# Patient Record
Sex: Male | Born: 1945
Health system: Southern US, Community
[De-identification: ages and names within clinical notes are randomized; demographics above are authoritative.]

## PROBLEM LIST (undated history)

## (undated) ENCOUNTER — Emergency Department (HOSPITAL_COMMUNITY): Admission: EM | Discharge status: Absent without leave | Payer: PPO

## (undated) DIAGNOSIS — I509 Heart failure, unspecified: Secondary | ICD-10-CM

---

## 2016-06-10 DIAGNOSIS — M9905 Segmental and somatic dysfunction of pelvic region: Secondary | ICD-10-CM | POA: Diagnosis not present

## 2016-06-10 DIAGNOSIS — M9903 Segmental and somatic dysfunction of lumbar region: Secondary | ICD-10-CM | POA: Diagnosis not present

## 2016-06-10 DIAGNOSIS — M9902 Segmental and somatic dysfunction of thoracic region: Secondary | ICD-10-CM | POA: Diagnosis not present

## 2016-06-10 DIAGNOSIS — M545 Low back pain: Secondary | ICD-10-CM | POA: Diagnosis not present

## 2016-12-28 DIAGNOSIS — M109 Gout, unspecified: Secondary | ICD-10-CM | POA: Diagnosis not present

## 2018-05-11 DIAGNOSIS — M9902 Segmental and somatic dysfunction of thoracic region: Secondary | ICD-10-CM | POA: Diagnosis not present

## 2018-05-11 DIAGNOSIS — M9905 Segmental and somatic dysfunction of pelvic region: Secondary | ICD-10-CM | POA: Diagnosis not present

## 2018-05-11 DIAGNOSIS — M9903 Segmental and somatic dysfunction of lumbar region: Secondary | ICD-10-CM | POA: Diagnosis not present

## 2018-05-11 DIAGNOSIS — M545 Low back pain: Secondary | ICD-10-CM | POA: Diagnosis not present

## 2018-05-26 DIAGNOSIS — R008 Other abnormalities of heart beat: Secondary | ICD-10-CM | POA: Diagnosis not present

## 2018-05-26 DIAGNOSIS — Z125 Encounter for screening for malignant neoplasm of prostate: Secondary | ICD-10-CM | POA: Diagnosis not present

## 2018-05-26 DIAGNOSIS — E039 Hypothyroidism, unspecified: Secondary | ICD-10-CM | POA: Diagnosis not present

## 2018-05-26 DIAGNOSIS — B372 Candidiasis of skin and nail: Secondary | ICD-10-CM | POA: Diagnosis not present

## 2018-05-26 DIAGNOSIS — R109 Unspecified abdominal pain: Secondary | ICD-10-CM | POA: Diagnosis not present

## 2018-05-26 DIAGNOSIS — N400 Enlarged prostate without lower urinary tract symptoms: Secondary | ICD-10-CM | POA: Diagnosis not present

## 2018-05-26 DIAGNOSIS — R030 Elevated blood-pressure reading, without diagnosis of hypertension: Secondary | ICD-10-CM | POA: Diagnosis not present

## 2018-05-26 DIAGNOSIS — G4700 Insomnia, unspecified: Secondary | ICD-10-CM | POA: Diagnosis not present

## 2018-05-26 DIAGNOSIS — B351 Tinea unguium: Secondary | ICD-10-CM | POA: Diagnosis not present

## 2018-05-26 DIAGNOSIS — K529 Noninfective gastroenteritis and colitis, unspecified: Secondary | ICD-10-CM | POA: Diagnosis not present

## 2018-06-30 DIAGNOSIS — K529 Noninfective gastroenteritis and colitis, unspecified: Secondary | ICD-10-CM | POA: Diagnosis not present

## 2018-06-30 DIAGNOSIS — Z13220 Encounter for screening for lipoid disorders: Secondary | ICD-10-CM | POA: Diagnosis not present

## 2018-06-30 DIAGNOSIS — D7589 Other specified diseases of blood and blood-forming organs: Secondary | ICD-10-CM | POA: Diagnosis not present

## 2018-06-30 DIAGNOSIS — E8809 Other disorders of plasma-protein metabolism, not elsewhere classified: Secondary | ICD-10-CM | POA: Diagnosis not present

## 2018-06-30 DIAGNOSIS — Z0000 Encounter for general adult medical examination without abnormal findings: Secondary | ICD-10-CM | POA: Diagnosis not present

## 2018-06-30 DIAGNOSIS — K922 Gastrointestinal hemorrhage, unspecified: Secondary | ICD-10-CM | POA: Diagnosis not present

## 2018-06-30 DIAGNOSIS — R008 Other abnormalities of heart beat: Secondary | ICD-10-CM | POA: Diagnosis not present

## 2018-07-26 DIAGNOSIS — R739 Hyperglycemia, unspecified: Secondary | ICD-10-CM | POA: Diagnosis not present

## 2018-07-26 DIAGNOSIS — E538 Deficiency of other specified B group vitamins: Secondary | ICD-10-CM | POA: Diagnosis not present

## 2018-07-26 DIAGNOSIS — K529 Noninfective gastroenteritis and colitis, unspecified: Secondary | ICD-10-CM | POA: Diagnosis not present

## 2018-07-26 DIAGNOSIS — K922 Gastrointestinal hemorrhage, unspecified: Secondary | ICD-10-CM | POA: Diagnosis not present

## 2018-08-25 DIAGNOSIS — R1031 Right lower quadrant pain: Secondary | ICD-10-CM | POA: Diagnosis not present

## 2018-08-25 DIAGNOSIS — R195 Other fecal abnormalities: Secondary | ICD-10-CM | POA: Diagnosis not present

## 2018-09-21 DIAGNOSIS — C189 Malignant neoplasm of colon, unspecified: Secondary | ICD-10-CM | POA: Diagnosis not present

## 2018-09-21 DIAGNOSIS — R1031 Right lower quadrant pain: Secondary | ICD-10-CM | POA: Diagnosis not present

## 2018-09-21 DIAGNOSIS — K5730 Diverticulosis of large intestine without perforation or abscess without bleeding: Secondary | ICD-10-CM | POA: Diagnosis not present

## 2018-09-21 DIAGNOSIS — C187 Malignant neoplasm of sigmoid colon: Secondary | ICD-10-CM | POA: Diagnosis not present

## 2018-09-21 DIAGNOSIS — R195 Other fecal abnormalities: Secondary | ICD-10-CM | POA: Diagnosis not present

## 2018-09-26 DIAGNOSIS — N400 Enlarged prostate without lower urinary tract symptoms: Secondary | ICD-10-CM | POA: Diagnosis not present

## 2018-09-26 DIAGNOSIS — I700 Atherosclerosis of aorta: Secondary | ICD-10-CM | POA: Diagnosis not present

## 2018-09-26 DIAGNOSIS — C187 Malignant neoplasm of sigmoid colon: Secondary | ICD-10-CM | POA: Diagnosis not present

## 2018-09-27 DIAGNOSIS — C187 Malignant neoplasm of sigmoid colon: Secondary | ICD-10-CM | POA: Diagnosis not present

## 2018-09-27 DIAGNOSIS — Z6823 Body mass index (BMI) 23.0-23.9, adult: Secondary | ICD-10-CM | POA: Diagnosis not present

## 2018-09-28 DIAGNOSIS — C187 Malignant neoplasm of sigmoid colon: Secondary | ICD-10-CM | POA: Diagnosis not present

## 2018-09-28 DIAGNOSIS — N401 Enlarged prostate with lower urinary tract symptoms: Secondary | ICD-10-CM | POA: Diagnosis not present

## 2018-10-10 DIAGNOSIS — N401 Enlarged prostate with lower urinary tract symptoms: Secondary | ICD-10-CM | POA: Diagnosis not present

## 2018-10-10 DIAGNOSIS — Z87891 Personal history of nicotine dependence: Secondary | ICD-10-CM | POA: Diagnosis not present

## 2018-10-10 DIAGNOSIS — M109 Gout, unspecified: Secondary | ICD-10-CM | POA: Diagnosis not present

## 2018-10-10 DIAGNOSIS — C187 Malignant neoplasm of sigmoid colon: Secondary | ICD-10-CM | POA: Diagnosis not present

## 2018-10-10 DIAGNOSIS — Z298 Encounter for other specified prophylactic measures: Secondary | ICD-10-CM | POA: Diagnosis not present

## 2018-10-10 DIAGNOSIS — K219 Gastro-esophageal reflux disease without esophagitis: Secondary | ICD-10-CM | POA: Diagnosis not present

## 2018-10-10 DIAGNOSIS — Z79899 Other long term (current) drug therapy: Secondary | ICD-10-CM | POA: Diagnosis not present

## 2018-10-10 DIAGNOSIS — N400 Enlarged prostate without lower urinary tract symptoms: Secondary | ICD-10-CM | POA: Diagnosis not present

## 2018-10-10 DIAGNOSIS — Z01818 Encounter for other preprocedural examination: Secondary | ICD-10-CM | POA: Diagnosis not present

## 2018-10-12 DIAGNOSIS — N401 Enlarged prostate with lower urinary tract symptoms: Secondary | ICD-10-CM | POA: Diagnosis not present

## 2018-10-12 DIAGNOSIS — C187 Malignant neoplasm of sigmoid colon: Secondary | ICD-10-CM | POA: Diagnosis not present

## 2018-10-12 DIAGNOSIS — R338 Other retention of urine: Secondary | ICD-10-CM | POA: Diagnosis not present

## 2018-10-13 ENCOUNTER — Other Ambulatory Visit: Payer: Self-pay

## 2018-10-13 NOTE — Patient Outreach (Signed)
[facility]) Care Management  10/13/2018  VICTORHUGO PREIS 1946/09/04 [id number]     Transition of Care Referral  Referral Date: 10/13/18 Referral Source: HTA Discharge Report Date of Admission:unknown Diagnosis: unknown Date of Discharge: 10/11/18 Facility: Health Insurance: HTA    Outreach attempt # 1 to patient. No answer after multiple rings and unable to leave message.    Plan: RN CM will make outreach attempt to patient within 3-4 business days. RN CM will send unsuccessful letter to patient.   Enzo Montgomery, RN,BSN,CCM [place] Management Telephonic Care Management Coordinator Direct Phone: [phone] Toll Free: [phone] Fax: [phone]

## 2018-10-14 ENCOUNTER — Other Ambulatory Visit: Payer: PPO

## 2018-10-14 NOTE — Patient Outreach (Signed)
[facility]) Care Management  10/14/2018  Bryce Cruz 09/29/1946 [id number]   Transition of Care Referral  Referral Date: 10/13/18 Referral Source: HTA Discharge Report Date of Admission:unknown Diagnosis: unknown Date of Discharge: 10/11/18 Facility:[facility] Insurance: HTA   Outreach attempt #2 to patient. No answer at present.    Plan: RN CM will make outreach attempt to patient within 3-4 business days.  Enzo Montgomery, RN,BSN,CCM [place] Management Telephonic Care Management Coordinator Direct Phone: [phone] Toll Free: [phone] Fax: [phone]

## 2018-10-17 DIAGNOSIS — N401 Enlarged prostate with lower urinary tract symptoms: Secondary | ICD-10-CM | POA: Diagnosis not present

## 2018-10-17 DIAGNOSIS — R338 Other retention of urine: Secondary | ICD-10-CM | POA: Diagnosis not present

## 2018-10-20 ENCOUNTER — Other Ambulatory Visit: Payer: Self-pay

## 2018-10-20 NOTE — Patient Outreach (Signed)
[facility]) Care Management  10/20/2018  FERREL SIMINGTON [birth year] [id number]   Transition of Care Referral  Referral Date:10/13/18 Referral Source:HTA Discharge Report Date of Admission:unknown Diagnosis:unknown Date of Discharge:10/11/18 Facility:[facility] Insurance:HTA    Outreach attempt #3 to patient. No answer at present.     Plan: RN CM will close case if no response from letter mailed to patient.   Enzo Montgomery, RN,BSN,CCM [place] Management Telephonic Care Management Coordinator Direct Phone: [phone] Toll Free: [phone] Fax: [phone]

## 2018-10-21 DIAGNOSIS — C187 Malignant neoplasm of sigmoid colon: Secondary | ICD-10-CM | POA: Diagnosis not present

## 2018-10-27 DIAGNOSIS — N401 Enlarged prostate with lower urinary tract symptoms: Secondary | ICD-10-CM | POA: Diagnosis not present

## 2018-10-27 DIAGNOSIS — C187 Malignant neoplasm of sigmoid colon: Secondary | ICD-10-CM | POA: Diagnosis not present

## 2018-10-31 ENCOUNTER — Other Ambulatory Visit: Payer: Self-pay

## 2018-10-31 NOTE — Patient Outreach (Signed)
[facility]) Care Management  10/31/2018  Bryce Cruz November 02, 1945 [id number]   Transition of Care Referral  Referral Date:10/13/18 Referral Source:HTA Discharge Report Date of Admission:unknown Diagnosis:unknown Date of Discharge:10/11/18 Facility:[facility] Insurance:HTA   Multiple attempts to establish contact with patient without success. No response from letter mailed to patient. Case is being closed at this time.     Plan: RN CM will close case at this time.  Enzo [place], RN,BSN,CCM [place] Management Telephonic Care Management Coordinator Direct Phone: [phone] Toll Free: [phone] Fax: [phone]

## 2018-11-14 DIAGNOSIS — N400 Enlarged prostate without lower urinary tract symptoms: Secondary | ICD-10-CM | POA: Diagnosis not present

## 2018-11-14 DIAGNOSIS — Z79899 Other long term (current) drug therapy: Secondary | ICD-10-CM | POA: Diagnosis not present

## 2018-11-14 DIAGNOSIS — C187 Malignant neoplasm of sigmoid colon: Secondary | ICD-10-CM | POA: Diagnosis not present

## 2018-11-29 DIAGNOSIS — R339 Retention of urine, unspecified: Secondary | ICD-10-CM | POA: Diagnosis not present

## 2018-11-29 DIAGNOSIS — Z6824 Body mass index (BMI) 24.0-24.9, adult: Secondary | ICD-10-CM | POA: Diagnosis not present

## 2018-11-29 DIAGNOSIS — C187 Malignant neoplasm of sigmoid colon: Secondary | ICD-10-CM | POA: Diagnosis not present

## 2018-11-29 DIAGNOSIS — N390 Urinary tract infection, site not specified: Secondary | ICD-10-CM | POA: Diagnosis not present

## 2018-11-29 DIAGNOSIS — N401 Enlarged prostate with lower urinary tract symptoms: Secondary | ICD-10-CM | POA: Diagnosis not present

## 2018-12-08 DIAGNOSIS — B370 Candidal stomatitis: Secondary | ICD-10-CM | POA: Diagnosis not present

## 2018-12-08 DIAGNOSIS — R339 Retention of urine, unspecified: Secondary | ICD-10-CM | POA: Diagnosis not present

## 2018-12-08 DIAGNOSIS — N401 Enlarged prostate with lower urinary tract symptoms: Secondary | ICD-10-CM | POA: Diagnosis not present

## 2018-12-08 DIAGNOSIS — N390 Urinary tract infection, site not specified: Secondary | ICD-10-CM | POA: Diagnosis not present

## 2019-02-08 DIAGNOSIS — R030 Elevated blood-pressure reading, without diagnosis of hypertension: Secondary | ICD-10-CM | POA: Diagnosis not present

## 2019-02-08 DIAGNOSIS — J302 Other seasonal allergic rhinitis: Secondary | ICD-10-CM | POA: Diagnosis not present

## 2019-02-08 DIAGNOSIS — R05 Cough: Secondary | ICD-10-CM | POA: Diagnosis not present

## 2019-02-08 DIAGNOSIS — C187 Malignant neoplasm of sigmoid colon: Secondary | ICD-10-CM | POA: Diagnosis not present

## 2019-02-17 DIAGNOSIS — Z79899 Other long term (current) drug therapy: Secondary | ICD-10-CM | POA: Diagnosis not present

## 2019-02-17 DIAGNOSIS — Z85038 Personal history of other malignant neoplasm of large intestine: Secondary | ICD-10-CM | POA: Diagnosis not present

## 2019-02-17 DIAGNOSIS — I447 Left bundle-branch block, unspecified: Secondary | ICD-10-CM | POA: Diagnosis not present

## 2019-02-17 DIAGNOSIS — M109 Gout, unspecified: Secondary | ICD-10-CM | POA: Diagnosis not present

## 2019-02-17 DIAGNOSIS — R0602 Shortness of breath: Secondary | ICD-10-CM | POA: Diagnosis not present

## 2019-02-17 DIAGNOSIS — F17210 Nicotine dependence, cigarettes, uncomplicated: Secondary | ICD-10-CM | POA: Diagnosis not present

## 2019-02-17 DIAGNOSIS — I5023 Acute on chronic systolic (congestive) heart failure: Secondary | ICD-10-CM | POA: Diagnosis not present

## 2019-02-17 DIAGNOSIS — I509 Heart failure, unspecified: Secondary | ICD-10-CM | POA: Diagnosis not present

## 2019-02-17 DIAGNOSIS — R740 Nonspecific elevation of levels of transaminase and lactic acid dehydrogenase [LDH]: Secondary | ICD-10-CM | POA: Diagnosis not present

## 2019-02-18 DIAGNOSIS — I5021 Acute systolic (congestive) heart failure: Secondary | ICD-10-CM | POA: Diagnosis not present

## 2019-02-18 DIAGNOSIS — R0602 Shortness of breath: Secondary | ICD-10-CM | POA: Diagnosis not present

## 2019-02-18 DIAGNOSIS — I447 Left bundle-branch block, unspecified: Secondary | ICD-10-CM | POA: Diagnosis not present

## 2019-02-18 DIAGNOSIS — I340 Nonrheumatic mitral (valve) insufficiency: Secondary | ICD-10-CM | POA: Diagnosis not present

## 2019-02-18 DIAGNOSIS — I361 Nonrheumatic tricuspid (valve) insufficiency: Secondary | ICD-10-CM | POA: Diagnosis not present

## 2019-02-18 DIAGNOSIS — I509 Heart failure, unspecified: Secondary | ICD-10-CM | POA: Diagnosis not present

## 2019-02-18 DIAGNOSIS — C187 Malignant neoplasm of sigmoid colon: Secondary | ICD-10-CM | POA: Diagnosis not present

## 2019-02-21 ENCOUNTER — Ambulatory Visit (HOSPITAL_COMMUNITY)
Admission: RE | Admit: 2019-02-21 | Discharge: 2019-02-21 | Disposition: A | Discharge status: Home / Self Care | Payer: PPO | Source: Ambulatory Visit | Attending: Internal Medicine | Admitting: Internal Medicine

## 2019-02-21 ENCOUNTER — Other Ambulatory Visit: Payer: Self-pay

## 2019-02-21 DIAGNOSIS — I447 Left bundle-branch block, unspecified: Secondary | ICD-10-CM | POA: Diagnosis not present

## 2019-02-21 DIAGNOSIS — I5021 Acute systolic (congestive) heart failure: Secondary | ICD-10-CM

## 2019-02-21 MED ORDER — FUROSEMIDE 40 MG PO TABS: 40.0000 mg | ORAL_TABLET | Freq: Every day | ORAL | 6 refills | Status: DC

## 2019-02-21 MED ORDER — POTASSIUM CHLORIDE CRYS ER 20 MEQ PO TBCR: 20.0000 meq | EXTENDED_RELEASE_TABLET | Freq: Every day | ORAL | 6 refills | Status: DC

## 2019-02-21 NOTE — Addendum Note (Signed)
Encounter addended by: Scarlette Calico, RN on: 02/21/2019 1:46 PM  Actions taken: Clinical Note Signed

## 2019-02-21 NOTE — Patient Instructions (Signed)
Start Furosemide 40 mg daily  Start Potassium (k-dur) 20 meq daily  Patient aware of changes via phone  Follow up 5/12 at 3pm with telehealth visit with Dr Haroldine Laws

## 2019-02-21 NOTE — Addendum Note (Signed)
Encounter addended by: Scarlette Calico, RN on: 02/21/2019 3:38 PM  Actions taken: Pharmacy for encounter modified, Order list changed, Clinical Note Signed

## 2019-02-21 NOTE — Progress Notes (Signed)
Orders per Dr Haroldine Laws: 1. Please call in lasix 40mg  and kcl 20 meq take prn only for SOB and swelling.  2. Telehealth visit with me in 2 weeks  3. R/L cath when we are able to schedule.    Attempted to call pt to discuss and Left message to call back to discuss and let us know what pharmacy to send meds to .

## 2019-02-21 NOTE — Progress Notes (Signed)
Spoke w/pt via phone.  He is aware, agreeable and verbalizes understanding of new prescriptions, they have been sent to pharmacy for him.  Follow up telehealth visit sch for 5/12 with Dr Haroldine Laws.

## 2019-02-21 NOTE — Progress Notes (Addendum)
Heart Failure TeleHealth Note  Due to national recommendations of social distancing due to [place] 19, Audio/video telehealth visit is felt to be most appropriate for this patient at this time.  See MyChart message from today for patient consent regarding telehealth for [name].  Date:  02/21/2019   ID:  Bryce Cruz, DOB Nov 09, 1945, MRN [id number]  Location: Home  Provider location: [facility] Type of Visit: New consult from Dr. Fredricka Bonine  PCP:  Patient, No Pcp Per  Cardiologist:  No primary care provider on file. Primary HF: New- Bryce Cruz  Chief Complaint: Heart Failure follow-up   History of Present Illness:  73 y/o male with h/o colon cancer (resected 12/19. No chemo yet) but no other PMHx.  Admitted to [facility] this past weekend (02/18/19) with acute HF and orthopnea. EF found to be 20% by echo. Also found to have LBBB. No CP trop negative. Diuresed and discharged home on spitro 12.5, losartan 25  He presents via [employer] (consulting) for a telehealth HF consult today.     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 Denies any h/o CP or SOB prior. Very rare smoking. I "bum a cigarette once in a while."   Now feeling better. No further orthopnea or PND. Denies CP. Back to baseline doing all activities without problem though he says his energy level still isn't back to normal after colon surgery.  No dizziness.  BP 112/54.   Bryce Cruz denies symptoms worrisome for COVID 19.   PMHx 1. LBBB (unknown duration) 2. Colon cancer s/r resection  Medications 1. Spiro 12.5 daily 2. Losartan 25 daily   Allergies:  NKDA  Social History:  Occasional tobacco. No ETOH   Family History:  Mom - CAD s/p CABG deceased Dad - died from SCD (heavy smoker and drinker) Sister with PPM Younger sister is disabled  Review of Systems: [y] = yes, [ ]  = no    General: Weight gain [] ; Weight loss [ ] ; Anorexia [  ]; Fatigue [y]; Fever [ ] ; Chills [ ] ; Weakness [ ]    Cardiac: Chest pain/pressure [ ] ; Resting SOB [  ]; Exertional SOB [ ] ; Orthopnea Blue.Reese ]; Pedal Edema [] ; Palpitations [ ] ; Syncope [ ] ; Presyncope [ ] ; Paroxysmal nocturnal dyspnea[ ]    Pulmonary: Cough [ ] ; Wheezing[ ] ; Hemoptysis[ ] ; Sputum [ ] ; Snoring [ ]    GI: Vomiting[ ] ; Dysphagia[ ] ; Melena[ ] ; Hematochezia [ ] ; Heartburn[ ] ; Abdominal pain [ ] ; Constipation [ ] ; Diarrhea [ ] ; BRBPR [ ]    GU: Hematuria[ ] ; Dysuria [ ] ; Nocturia[ ]   Vascular: Pain in legs with walking [ ] ; Pain in feet with lying flat [ ] ; Non-healing sores [ ] ; Stroke [ ] ; TIA [ ] ; Slurred speech [ ] ;   Neuro: Headaches[ ] ; Vertigo[ ] ; Seizures[ ] ; Paresthesias[ ] ;Blurred vision [ ] ; Diplopia [ ] ; Vision changes [ ]    Ortho/Skin: Arthritis [y]; Joint pain [y]; Muscle pain [ ] ; Joint swelling [ ] ; Back Pain [ ] ; Rash [ ]    Psych: Depression[ ] ; Anxiety[ ]    Heme: Bleeding problems [ ] ; Clotting disorders [ ] ; Anemia [ ]    Endocrine: Diabetes [ ] ; Thyroid dysfunction[ ]    Exam:  (Video/Tele Health Call; Exam is subjective and or/visual.) General:  Speaks in full sentences. No resp difficulty. Lungs: Normal respiratory effort with conversation.  Abdomen: Non-distended per patient report Extremities: Pt denies edema. Neuro: Alert & oriented x  3.   Recent Labs: No results found for requested labs within last 8760 hours.  Personally reviewed   Wt Readings from Last 3 Encounters:  No data found for Wt      ASSESSMENT AND PLAN:  1.  Acute systolic HF - new diagnosis 4/20 - Etiology unclear. Possibly LBBB-CM (was told his ECG was a bit abnormal prior to colon cancer surgery) - Volume status ok. NYHA I-II - Continue spiro 12.5 and losartan 25 daily - Will call in lasix 40 and Kcl 20 prn for volume overload.  - Will repeat telehealth visit in 2 weeks - Will need cath and cMRI once restrictions lifted in early June.  - Discussed possible need  for CRT in future.   2. Colon CA - s/p resection    COVID screen The patient does not have any symptoms that suggest any further testing/ screening at this time.  Social distancing reinforced today.  Recommended follow-up:  As above  Relevant cardiac medications were reviewed at length with the patient today.   The patient does not have concerns regarding their medications at this time.   The following changes were made today:  As above  Today, I have spent 40 minutes with the patient with telehealth technology discussing the above issues .    Signed, Glori Bickers, MD  02/21/2019 10:05 AM  Advanced Heart Failure [place] [address] [phone] (office) [phone] (fax)

## 2019-03-07 ENCOUNTER — Other Ambulatory Visit: Payer: Self-pay

## 2019-03-07 ENCOUNTER — Ambulatory Visit (HOSPITAL_COMMUNITY)
Admission: RE | Admit: 2019-03-07 | Discharge: 2019-03-07 | Disposition: A | Discharge status: Home / Self Care | Payer: PPO | Source: Ambulatory Visit | Attending: Internal Medicine | Admitting: Internal Medicine

## 2019-03-07 DIAGNOSIS — I447 Left bundle-branch block, unspecified: Secondary | ICD-10-CM

## 2019-03-07 DIAGNOSIS — I5022 Chronic systolic (congestive) heart failure: Secondary | ICD-10-CM | POA: Diagnosis not present

## 2019-03-07 MED ORDER — LOSARTAN POTASSIUM 50 MG PO TABS: 50.0000 mg | ORAL_TABLET | Freq: Every day | ORAL | 1 refills | Status: DC

## 2019-03-07 NOTE — Progress Notes (Addendum)
Spoke with patient about AVS instructions. Pt will have labs done at Dr. Oren Binet office this week.  Pt also notes that he cannot schedule R/L CATH at this time because he does not know his schedule for Late June. Advised to call office early June to schedule.  Pt amenable to plan.  Pt verbalized understanding of instructions. AVS mailed

## 2019-03-07 NOTE — Addendum Note (Signed)
Encounter addended by: Valeda Malm, RN on: 03/07/2019 4:32 PM  Actions taken: Order list changed, Diagnosis association updated, Clinical Note Signed

## 2019-03-07 NOTE — Patient Instructions (Addendum)
INCREASE Losartan to 50mg  (1 tab) daily  Labs this week at Dr. Joya Gaskins office We will only contact you if something comes back abnormal or we need to make some changes. Otherwise no news is good news!   Your physician recommends that you schedule a follow-up appointment in: mid June for Cath and Cardiac MRI. PLEASE CALL OFFICE EARLY June TO SCHEDULE CATH [phone].

## 2019-03-07 NOTE — Progress Notes (Addendum)
Heart Failure TeleHealth Note  Due to national recommendations of social distancing due to [place] 19, Audio/video telehealth visit is felt to be most appropriate for this patient at this time.  See MyChart message from today for patient consent regarding telehealth for [facility]  Date:  03/07/2019   ID:  Bryce Cruz, DOB [birth year], MRN [id number]  Location: Home  Provider location: [facility] Type of Visit: New consult from Dr. Fredricka Bonine  PCP:  Patient, No Pcp Per  Cardiologist:  No primary care provider on file. Primary HF: New- Bryce Cruz  Chief Complaint: Heart Failure follow-up   History of Present Illness:  73 y/o male with h/o colon cancer (resected 12/19. No chemo yet) but no other PMHx.  Admitted to [facility] this past weekend (02/18/19) with acute HF and orthopnea. EF found to be 20% by echo. Also found to have LBBB. No CP trop negative. Diuresed and discharged home on spitro 12.5, losartan 25  He presents via [employer] (consulting) for a telehealth HF visit today in setting of COVID pandemic.  At our last visit was feeling well. Getting his strength back after colon surgery. BP low so we didn't titrate meds aggressively. Did give him prn lasix to use for any volume overload. Using lasix 20 bid. No SOB, orthopnea, edema or dizziness.  Checking BP every day. SBP 118-125  HR 60-70   Bryce Cruz denies symptoms worrisome for COVID 19.   PMHx 1. LBBB (unknown duration) 2. Colon cancer s/r resection  Medications 1. Spiro 12.5 daily 2. Losartan 25 daily  3. Lasix 40 with Kcl 20 taking PRN onl  Allergies:  NKDA  Social History:  Occasional tobacco. No ETOH   Family History:  Mom - CAD s/p CABG deceased Dad - died from SCD (heavy smoker and drinker) Sister with PPM Younger sister is disabled  Review of Systems: [y] = yes, [ ]  = no    General: Weight gain [] ; Weight loss [ ] ; Anorexia [ ] ; Fatigue [y]; Fever [ ] ;  Chills [ ] ; Weakness [ ]    Cardiac: Chest pain/pressure [ ] ; Resting SOB [  ]; Exertional SOB [ ] ; Orthopnea Blue.Reese ]; Pedal Edema [] ; Palpitations [ ] ; Syncope [ ] ; Presyncope [ ] ; Paroxysmal nocturnal dyspnea[ ]    Pulmonary: Cough [ ] ; Wheezing[ ] ; Hemoptysis[ ] ; Sputum [ ] ; Snoring [ ]    GI: Vomiting[ ] ; Dysphagia[ ] ; Melena[ ] ; Hematochezia [ ] ; Heartburn[ ] ; Abdominal pain [ ] ; Constipation [ ] ; Diarrhea [ ] ; BRBPR [ ]    GU: Hematuria[ ] ; Dysuria [ ] ; Nocturia[ ]   Vascular: Pain in legs with walking [ ] ; Pain in feet with lying flat [ ] ; Non-healing sores [ ] ; Stroke [ ] ; TIA [ ] ; Slurred speech [ ] ;   Neuro: Headaches[ ] ; Vertigo[ ] ; Seizures[ ] ; Paresthesias[ ] ;Blurred vision [ ] ; Diplopia [ ] ; Vision changes [ ]    Ortho/Skin: Arthritis [y]; Joint pain [y]; Muscle pain [ ] ; Joint swelling [ ] ; Back Pain [ ] ; Rash [ ]    Psych: Depression[ ] ; Anxiety[ ]    Heme: Bleeding problems [ ] ; Clotting disorders [ ] ; Anemia [ ]    Endocrine: Diabetes [ ] ; Thyroid dysfunction[ ]    Exam:  (Video/Tele Health Call; Exam is subjective and or/visual.) General:  Speaks in full sentences. No resp difficulty. Lungs: Normal respiratory effort with conversation.  Abdomen: Non-distended per patient report Extremities: Pt denies edema. Neuro: Alert & oriented x 3.   Recent Labs:  No results found for requested labs within last 8760 hours.  Personally reviewed   Wt Readings from Last 3 Encounters:  No data found for Wt      ASSESSMENT AND PLAN:  1.  Chronic systolic HF - new diagnosis 4/20 - Etiology unclear. Possibly LBBB-CM (was told his ECG was a bit abnormal prior to colon cancer surgery) - Volume status ok. NYHA I-II - Continue spiro 12.5 - Increase losartan to 50 daily. If tolerates can switch to [employer] - Continue lasix 20 bid and Kcl 20 only for volume overload.  - Will need cath and cMRI once restrictions lifted in mid June.  - Discussed possible need for CRT in future.    2. Colon CA - s/p resection    COVID screen The patient does not have any symptoms that suggest any further testing/ screening at this time.  Social distancing reinforced today.  Recommended follow-up:  As above  Relevant cardiac medications were reviewed at length with the patient today.   The patient does not have concerns regarding their medications at this time.   The following changes were made today:  As above  Today, I have spent 22 minutes with the patient with telehealth technology discussing the above issues .    Signed, Glori Bickers, MD  03/07/2019 2:40 PM  Advanced Heart Failure [place] [address] Heart and [place] [address] [phone] (office) [phone] (fax)

## 2019-03-07 NOTE — Progress Notes (Signed)
Called patient to discuss AVS. Will call back as there was no answer

## 2019-03-07 NOTE — Addendum Note (Signed)
Encounter addended by: Valeda Malm, RN on: 03/07/2019 3:15 PM  Actions taken: Order list changed, Diagnosis association updated, Clinical Note Signed

## 2019-03-08 DIAGNOSIS — N401 Enlarged prostate with lower urinary tract symptoms: Secondary | ICD-10-CM | POA: Diagnosis not present

## 2019-03-08 DIAGNOSIS — R339 Retention of urine, unspecified: Secondary | ICD-10-CM | POA: Diagnosis not present

## 2019-03-10 ENCOUNTER — Other Ambulatory Visit: Payer: Self-pay | Admitting: Family

## 2019-03-10 ENCOUNTER — Other Ambulatory Visit: Payer: Self-pay | Admitting: *Deleted

## 2019-03-10 DIAGNOSIS — I5022 Chronic systolic (congestive) heart failure: Secondary | ICD-10-CM | POA: Diagnosis not present

## 2019-03-10 DIAGNOSIS — R609 Edema, unspecified: Secondary | ICD-10-CM

## 2019-03-11 LAB — BASIC METABOLIC PANEL
BUN/Creatinine Ratio: 20 (ref 10–24)
BUN: 24 mg/dL (ref 8–27)
CO2: 23 mmol/L (ref 20–29)
Calcium: 9.2 mg/dL (ref 8.6–10.2)
Chloride: 103 mmol/L (ref 96–106)
Creatinine, Ser: 1.22 mg/dL (ref 0.76–1.27)
GFR calc Af Amer: 68 mL/min/{1.73_m2} (ref >59)
GFR calc non Af Amer: 58 mL/min/{1.73_m2} — ABNORMAL LOW (ref >59)
Glucose: 106 mg/dL — ABNORMAL HIGH (ref 65–99)
Potassium: 4.1 mmol/L (ref 3.5–5.2)
Sodium: 141 mmol/L (ref 134–144)

## 2019-03-11 LAB — PRO B NATRIURETIC PEPTIDE: NT-Pro BNP: 1460 pg/mL — ABNORMAL HIGH (ref 0–376)

## 2019-05-03 DIAGNOSIS — Z01818 Encounter for other preprocedural examination: Secondary | ICD-10-CM | POA: Diagnosis not present

## 2019-05-03 DIAGNOSIS — H25812 Combined forms of age-related cataract, left eye: Secondary | ICD-10-CM | POA: Diagnosis not present

## 2019-05-03 DIAGNOSIS — H2512 Age-related nuclear cataract, left eye: Secondary | ICD-10-CM | POA: Diagnosis not present

## 2019-05-03 DIAGNOSIS — H52222 Regular astigmatism, left eye: Secondary | ICD-10-CM | POA: Diagnosis not present

## 2019-05-10 DIAGNOSIS — H2511 Age-related nuclear cataract, right eye: Secondary | ICD-10-CM | POA: Diagnosis not present

## 2019-05-10 DIAGNOSIS — H52221 Regular astigmatism, right eye: Secondary | ICD-10-CM | POA: Diagnosis not present

## 2019-05-10 DIAGNOSIS — H25811 Combined forms of age-related cataract, right eye: Secondary | ICD-10-CM | POA: Diagnosis not present

## 2019-06-12 DIAGNOSIS — N401 Enlarged prostate with lower urinary tract symptoms: Secondary | ICD-10-CM | POA: Diagnosis not present

## 2019-06-12 DIAGNOSIS — R339 Retention of urine, unspecified: Secondary | ICD-10-CM | POA: Diagnosis not present

## 2019-07-13 DIAGNOSIS — Z125 Encounter for screening for malignant neoplasm of prostate: Secondary | ICD-10-CM | POA: Diagnosis not present

## 2019-07-13 DIAGNOSIS — Z0001 Encounter for general adult medical examination with abnormal findings: Secondary | ICD-10-CM | POA: Diagnosis not present

## 2019-07-13 DIAGNOSIS — B354 Tinea corporis: Secondary | ICD-10-CM | POA: Diagnosis not present

## 2019-07-13 DIAGNOSIS — G4700 Insomnia, unspecified: Secondary | ICD-10-CM | POA: Diagnosis not present

## 2019-07-13 DIAGNOSIS — I509 Heart failure, unspecified: Secondary | ICD-10-CM | POA: Diagnosis not present

## 2019-07-13 DIAGNOSIS — D649 Anemia, unspecified: Secondary | ICD-10-CM | POA: Diagnosis not present

## 2019-07-13 DIAGNOSIS — E039 Hypothyroidism, unspecified: Secondary | ICD-10-CM | POA: Diagnosis not present

## 2019-07-13 DIAGNOSIS — Z0000 Encounter for general adult medical examination without abnormal findings: Secondary | ICD-10-CM | POA: Diagnosis not present

## 2019-07-13 DIAGNOSIS — R008 Other abnormalities of heart beat: Secondary | ICD-10-CM | POA: Diagnosis not present

## 2019-07-13 DIAGNOSIS — E876 Hypokalemia: Secondary | ICD-10-CM | POA: Diagnosis not present

## 2019-07-13 DIAGNOSIS — N400 Enlarged prostate without lower urinary tract symptoms: Secondary | ICD-10-CM | POA: Diagnosis not present

## 2019-08-22 DIAGNOSIS — N401 Enlarged prostate with lower urinary tract symptoms: Secondary | ICD-10-CM | POA: Diagnosis not present

## 2019-08-22 DIAGNOSIS — R339 Retention of urine, unspecified: Secondary | ICD-10-CM | POA: Diagnosis not present

## 2019-09-26 DIAGNOSIS — Z85038 Personal history of other malignant neoplasm of large intestine: Secondary | ICD-10-CM | POA: Diagnosis not present

## 2019-11-14 ENCOUNTER — Other Ambulatory Visit (HOSPITAL_COMMUNITY): Payer: Self-pay | Admitting: Internal Medicine

## 2019-12-13 ENCOUNTER — Ambulatory Visit (HOSPITAL_COMMUNITY)
Admission: RE | Admit: 2019-12-13 | Discharge: 2019-12-13 | Disposition: A | Discharge status: Home / Self Care | Payer: PPO | Source: Ambulatory Visit | Attending: Internal Medicine | Admitting: Internal Medicine

## 2019-12-13 ENCOUNTER — Encounter (HOSPITAL_COMMUNITY): Payer: Self-pay | Admitting: Internal Medicine

## 2019-12-13 ENCOUNTER — Other Ambulatory Visit: Payer: Self-pay

## 2019-12-13 VITALS — BP 122/70 | HR 70 | Wt 159.6 lb

## 2019-12-13 DIAGNOSIS — I509 Heart failure, unspecified: Secondary | ICD-10-CM

## 2019-12-13 DIAGNOSIS — I5043 Acute on chronic combined systolic (congestive) and diastolic (congestive) heart failure: Secondary | ICD-10-CM | POA: Diagnosis not present

## 2019-12-13 DIAGNOSIS — I5022 Chronic systolic (congestive) heart failure: Secondary | ICD-10-CM | POA: Diagnosis not present

## 2019-12-13 DIAGNOSIS — C189 Malignant neoplasm of colon, unspecified: Secondary | ICD-10-CM | POA: Diagnosis not present

## 2019-12-13 DIAGNOSIS — I447 Left bundle-branch block, unspecified: Secondary | ICD-10-CM | POA: Diagnosis not present

## 2019-12-13 DIAGNOSIS — I493 Ventricular premature depolarization: Secondary | ICD-10-CM

## 2019-12-13 DIAGNOSIS — Z8249 Family history of ischemic heart disease and other diseases of the circulatory system: Secondary | ICD-10-CM | POA: Diagnosis not present

## 2019-12-13 DIAGNOSIS — Z79899 Other long term (current) drug therapy: Secondary | ICD-10-CM | POA: Insufficient documentation

## 2019-12-13 LAB — CBC
HCT: 43.4 % (ref 39.0–52.0)
Hemoglobin: 14.3 g/dL (ref 13.0–17.0)
MCH: 31.8 pg (ref 26.0–34.0)
MCHC: 32.9 g/dL (ref 30.0–36.0)
MCV: 96.7 fL (ref 80.0–100.0)
Platelets: 197 10*3/uL (ref 150–400)
RBC: 4.49 MIL/uL (ref 4.22–5.81)
RDW: 13.3 % (ref 11.5–15.5)
WBC: 7.5 10*3/uL (ref 4.0–10.5)
nRBC: 0 % (ref 0.0–0.2)

## 2019-12-13 LAB — BASIC METABOLIC PANEL
Anion gap: 10 (ref 5–15)
BUN: 30 mg/dL — ABNORMAL HIGH (ref 8–23)
CO2: 19 mmol/L — ABNORMAL LOW (ref 22–32)
Calcium: 9.3 mg/dL (ref 8.9–10.3)
Chloride: 111 mmol/L (ref 98–111)
Creatinine, Ser: 1.15 mg/dL (ref 0.61–1.24)
GFR calc Af Amer: >60 mL/min (ref >60)
GFR calc non Af Amer: >60 mL/min (ref >60)
Glucose, Bld: 98 mg/dL (ref 70–99)
Potassium: 4.2 mmol/L (ref 3.5–5.1)
Sodium: 140 mmol/L (ref 135–145)

## 2019-12-13 MED ORDER — SODIUM CHLORIDE 0.9% FLUSH: 3.0000 mL | Freq: Two times a day (BID) | INTRAVENOUS | Status: DC

## 2019-12-13 MED ORDER — CARVEDILOL 3.125 MG PO TABS: 3.1250 mg | ORAL_TABLET | Freq: Two times a day (BID) | ORAL | 11 refills | Status: DC

## 2019-12-13 NOTE — H&P (View-Only) (Signed)
     Advanced Heart Failure Clinic Note    Date:  12/13/2019   ID:  Bryce Cruz, DOB 01-19-1946, MRN [id number]  Location: Home  Provider location: [facility] Type of Visit: New consult from Dr. Fredricka Bonine  PCP:  Patient, No Pcp Per  Cardiologist:  No primary care provider on file. Primary HF: New- Bryce Cruz  Chief Complaint: Heart Failure follow-up   History of Present Illness:  74 y/o male with h/o colon cancer (resected 12/20. No chemo) but no other PMHx.  Admitted to [facility] 02/18/19  with acute HF and orthopnea. EF found to be 20% by echo. Also found to have LBBB. No CP trop negative. 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 No CP or SOB. No edema, orthopnea or PND. Taking all meds as prescribed. BP 120/70s   PMHx 1. LBBB (unknown duration) 2. Colon cancer s/p resection  Medications 1. Spiro 12.5 daily 2. Losartan 25 daily  3. Lasix 40 with Kcl 20 taking PRN onl  Allergies:  NKDA  Social History:  Occasional tobacco. No ETOH   Family History:  Mom - CAD s/p CABG deceased Dad - died from SCD (heavy smoker and drinker) Sister with PPM Younger sister is disabled  Vitals:   12/13/19 1514  BP: 122/70  Pulse: 70  SpO2: 97%     Exam:   General:  Well appearing. No resp difficulty HEENT: normal Neck: supple. no JVD. Carotids 2+ bilat; no bruits. No lymphadenopathy or thryomegaly appreciated. Cor: PMI nondisplaced. Regular rate & rhythm. No rubs, gallops or murmurs. Lungs: decreased breath sounds but clear Abdomen: soft, nontender, nondistended. No hepatosplenomegaly. No bruits or masses. Good bowel sounds. Extremities: no cyanosis, clubbing, rash, edema Neuro: alert & orientedx3, cranial nerves grossly intact. moves all 4 extremities w/o difficulty. Affect pleasant   Recent Labs: 03/10/2019: BUN 24; Creatinine, Ser 1.22; NT-Pro BNP 1,460; Potassium  4.1; Sodium 141  Personally reviewed   Wt Readings from Last 3 Encounters:  12/13/19 72.4 kg (159 lb 9.6 oz)    ECG: NSR 69 LBBB (162ms) frequent PVCs. Personally reviewed   ASSESSMENT AND PLAN:  1.  Chronic systolic HF - new diagnosis 4/20 - Etiology unclear. Possibly LBBB-CM (was told his ECG was a bit abnormal prior to colon cancer surgery) vs frequent PVCs vs ischemic (less likely) - Volume status ok. NYHA I - Continue spiro 12.5 - Continue losartan 50 daily. If tolerates can switch to [employer] carvedilol 3.125 bid - Continue lasix 20 bid and Kcl 20 only for volume overload.  - Will proceed with R/L cath and cMRI to further evaluate.  - Discussed possible need for CRT in future given suspicion for LBBB CM (vs PVC).   2. Colon CA - s/p resection  - stable in remission - refused chemo  3. Frequent PVCs - place Zio to quantify   Signed, Glori Bickers, MD  12/13/2019 4:00 PM  Advanced Heart Failure [place] [address] Heart and [place] [address] [phone] (office) [phone] (fax)

## 2019-12-13 NOTE — Patient Instructions (Signed)
START Coreg 3.125 mg, one tab twice daily  Your physician recommends that you schedule a follow-up appointment in: 4 months with Dr Haroldine Laws    You are scheduled for a Cardiac Catheterization on Tuesday, February 23 with Dr. Glori Bickers.  1. Please arrive at the [facility] (Main Entrance A) at [facility]: [address] [place], [place] [address] at 10:00 AM (This time is two hours before your procedure to ensure your preparation). Free valet parking service is available.   Special note: Every effort is made to have your procedure done on time. Please understand that emergencies sometimes delay scheduled procedures.  2. Diet: Do not eat solid foods after midnight.  The patient may have clear liquids until 5am upon the day of the procedure.  3. Labs: pre procedure labs done 12/13/2019  You will need a pre procedure COVID test    WHEN: 12/16/2019  anytime between 9am-3pm WHERE: [facility]  [place] [address]  This is a drive thru testing site, you will remain in your car. Be sure to get in the line FOR PROCEDURES Once you have been swabbed you will need to remain home in quarantine until you return for your procedure.   4. Medication instructions in preparation for your procedure:   Contrast Allergy: No    Stop taking, Lasix (Furosemide)  Tuesday, January 23,     On the morning of your procedure, take your Aspirin and any morning medicines NOT listed above.  You may use sips of water.  5. Plan for one night stay--bring personal belongings. 6. Bring a current list of your medications and current insurance cards. 7. You MUST have a responsible person to drive you home. 8. Someone MUST be with you the first 24 hours after you arrive home or your discharge will be delayed. 9. Please wear clothes that are easy to get on and off and wear slip-on shoes.  Thank you for allowing [place] to care for you!   -- [facility]  [facility]

## 2019-12-13 NOTE — Progress Notes (Addendum)
     Advanced Heart Failure Clinic Note    Date:  12/13/2019   ID:  Bryce Cruz, DOB 06-20-1946, MRN [id number]  Location: Home  Provider location: [facility] [facility] Type of Visit: New consult from Dr. Fredricka Bonine  PCP:  Patient, No Pcp Per  Cardiologist:  No primary care provider on file. Primary HF: New- Bryce Cruz  Chief Complaint: Heart Failure follow-up   History of Present Illness:  74 y/o male with h/o colon cancer (resected 12/20. No chemo) but no other PMHx.  Admitted to [facility] 02/18/19  with acute HF and orthopnea. EF found to be 20% by echo. Also found to have LBBB. No CP trop negative. 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 No CP or SOB. No edema, orthopnea or PND. Taking all meds as prescribed. BP 120/70s   PMHx 1. LBBB (unknown duration) 2. Colon cancer s/p resection  Medications 1. Spiro 12.5 daily 2. Losartan 25 daily  3. Lasix 40 with Kcl 20 taking PRN onl  Allergies:  NKDA  Social History:  Occasional tobacco. No ETOH   Family History:  Mom - CAD s/p CABG deceased Dad - died from SCD (heavy smoker and drinker) Sister with PPM Younger sister is disabled  Vitals:   12/13/19 1514  BP: 122/70  Pulse: 70  SpO2: 97%     Exam:   General:  Well appearing. No resp difficulty HEENT: normal Neck: supple. no JVD. Carotids 2+ bilat; no bruits. No lymphadenopathy or thryomegaly appreciated. Cor: PMI nondisplaced. Regular rate & rhythm. No rubs, gallops or murmurs. Lungs: decreased breath sounds but clear Abdomen: soft, nontender, nondistended. No hepatosplenomegaly. No bruits or masses. Good bowel sounds. Extremities: no cyanosis, clubbing, rash, edema Neuro: alert & orientedx3, cranial nerves grossly intact. moves all 4 extremities w/o difficulty. Affect pleasant   Recent Labs: 03/10/2019: BUN 24; Creatinine, Ser 1.22; NT-Pro BNP 1,460; Potassium  4.1; Sodium 141  Personally reviewed   Wt Readings from Last 3 Encounters:  12/13/19 72.4 kg (159 lb 9.6 oz)    ECG: NSR 69 LBBB (181ms) frequent PVCs. Personally reviewed   ASSESSMENT AND PLAN:  1.  Chronic systolic HF - new diagnosis 4/20 - Etiology unclear. Possibly LBBB-CM (was told his ECG was a bit abnormal prior to colon cancer surgery) vs frequent PVCs vs ischemic (less likely) - Volume status ok. NYHA I - Continue spiro 12.5 - Continue losartan 50 daily. If tolerates can switch to [employer] carvedilol 3.125 bid - Continue lasix 20 bid and Kcl 20 only for volume overload.  - Will proceed with R/L cath and cMRI to further evaluate.  - Discussed possible need for CRT in future given suspicion for LBBB CM (vs PVC).   2. Colon CA - s/p resection  - stable in remission - refused chemo  3. Frequent PVCs - place Zio to quantify   Signed, Glori Bickers, MD  12/13/2019 4:00 PM  Advanced Heart Failure [place] [address] Heart and [place] [address] [phone] ([phone] (fax)

## 2019-12-16 ENCOUNTER — Other Ambulatory Visit (HOSPITAL_COMMUNITY)
Admission: RE | Admit: 2019-12-16 | Discharge: 2019-12-16 | Disposition: A | Discharge status: Home / Self Care | Payer: PPO | Source: Ambulatory Visit | Attending: Internal Medicine | Admitting: Internal Medicine

## 2019-12-16 DIAGNOSIS — Z20822 Contact with and (suspected) exposure to covid-19: Secondary | ICD-10-CM | POA: Insufficient documentation

## 2019-12-16 DIAGNOSIS — Z01812 Encounter for preprocedural laboratory examination: Secondary | ICD-10-CM | POA: Insufficient documentation

## 2019-12-16 LAB — SARS CORONAVIRUS 2 (TAT 6-24 HRS): SARS Coronavirus 2: NEGATIVE

## 2019-12-19 ENCOUNTER — Ambulatory Visit (HOSPITAL_COMMUNITY)
Admission: RE | Disposition: A | Discharge status: Home / Self Care | Payer: Self-pay | Source: Home / Self Care | Attending: Internal Medicine

## 2019-12-19 ENCOUNTER — Other Ambulatory Visit: Payer: Self-pay

## 2019-12-19 ENCOUNTER — Ambulatory Visit (HOSPITAL_COMMUNITY)
Admission: RE | Admit: 2019-12-19 | Discharge: 2019-12-19 | Disposition: A | Discharge status: Home / Self Care | Payer: PPO | Attending: Internal Medicine | Admitting: Internal Medicine

## 2019-12-19 DIAGNOSIS — I2510 Atherosclerotic heart disease of native coronary artery without angina pectoris: Secondary | ICD-10-CM | POA: Diagnosis not present

## 2019-12-19 DIAGNOSIS — Z79899 Other long term (current) drug therapy: Secondary | ICD-10-CM | POA: Diagnosis not present

## 2019-12-19 DIAGNOSIS — I5022 Chronic systolic (congestive) heart failure: Secondary | ICD-10-CM | POA: Diagnosis not present

## 2019-12-19 DIAGNOSIS — Z8249 Family history of ischemic heart disease and other diseases of the circulatory system: Secondary | ICD-10-CM | POA: Insufficient documentation

## 2019-12-19 DIAGNOSIS — I493 Ventricular premature depolarization: Secondary | ICD-10-CM | POA: Diagnosis not present

## 2019-12-19 DIAGNOSIS — Z85038 Personal history of other malignant neoplasm of large intestine: Secondary | ICD-10-CM | POA: Diagnosis not present

## 2019-12-19 DIAGNOSIS — I428 Other cardiomyopathies: Secondary | ICD-10-CM | POA: Diagnosis not present

## 2019-12-19 DIAGNOSIS — I509 Heart failure, unspecified: Secondary | ICD-10-CM

## 2019-12-19 HISTORY — PX: RIGHT/LEFT HEART CATH AND CORONARY ANGIOGRAPHY: CATH118266

## 2019-12-19 LAB — POCT I-STAT EG7
Acid-base deficit: 4 mmol/L — ABNORMAL HIGH (ref 0.0–2.0)
Acid-base deficit: 6 mmol/L — ABNORMAL HIGH (ref 0.0–2.0)
Bicarbonate: 20.1 mmol/L (ref 20.0–28.0)
Bicarbonate: 22.3 mmol/L (ref 20.0–28.0)
Calcium, Ion: 1.02 mmol/L — ABNORMAL LOW (ref 1.15–1.40)
Calcium, Ion: 1.28 mmol/L (ref 1.15–1.40)
HCT: 33 % — ABNORMAL LOW (ref 39.0–52.0)
HCT: 36 % — ABNORMAL LOW (ref 39.0–52.0)
Hemoglobin: 11.2 g/dL — ABNORMAL LOW (ref 13.0–17.0)
Hemoglobin: 12.2 g/dL — ABNORMAL LOW (ref 13.0–17.0)
O2 Saturation: 68 %
O2 Saturation: 69 %
Potassium: 3.4 mmol/L — ABNORMAL LOW (ref 3.5–5.1)
Potassium: 4.1 mmol/L (ref 3.5–5.1)
Sodium: 143 mmol/L (ref 135–145)
Sodium: 147 mmol/L — ABNORMAL HIGH (ref 135–145)
TCO2: 21 mmol/L — ABNORMAL LOW (ref 22–32)
TCO2: 24 mmol/L (ref 22–32)
pCO2, Ven: 41.5 mmHg — ABNORMAL LOW (ref 44.0–60.0)
pCO2, Ven: 45.8 mmHg (ref 44.0–60.0)
pH, Ven: 7.293 (ref 7.250–7.430)
pH, Ven: 7.296 (ref 7.250–7.430)
pO2, Ven: 39 mmHg (ref 32.0–45.0)
pO2, Ven: 40 mmHg (ref 32.0–45.0)

## 2019-12-19 LAB — POCT I-STAT 7, (LYTES, BLD GAS, ICA,H+H)
Acid-base deficit: 4 mmol/L — ABNORMAL HIGH (ref 0.0–2.0)
Bicarbonate: 22.3 mmol/L (ref 20.0–28.0)
Calcium, Ion: 1.27 mmol/L (ref 1.15–1.40)
HCT: 36 % — ABNORMAL LOW (ref 39.0–52.0)
Hemoglobin: 12.2 g/dL — ABNORMAL LOW (ref 13.0–17.0)
O2 Saturation: 99 %
Potassium: 4.1 mmol/L (ref 3.5–5.1)
Sodium: 144 mmol/L (ref 135–145)
TCO2: 24 mmol/L (ref 22–32)
pCO2 arterial: 43.5 mmHg (ref 32.0–48.0)
pH, Arterial: 7.318 — ABNORMAL LOW (ref 7.350–7.450)
pO2, Arterial: 167 mmHg — ABNORMAL HIGH (ref 83.0–108.0)

## 2019-12-19 SURGERY — RIGHT/LEFT HEART CATH AND CORONARY ANGIOGRAPHY
Anesthesia: LOCAL

## 2019-12-19 MED ORDER — HEPARIN SODIUM (PORCINE) 1000 UNIT/ML IJ SOLN
INTRAMUSCULAR | Status: DC | PRN
  Administered 2019-12-19: 3500 [IU] via INTRAVENOUS

## 2019-12-19 MED ORDER — SODIUM CHLORIDE 0.9% FLUSH: 3.0000 mL | INTRAVENOUS | Status: DC | PRN

## 2019-12-19 MED ORDER — VERAPAMIL HCL 2.5 MG/ML IV SOLN
INTRAVENOUS | Status: DC | PRN
  Administered 2019-12-19: 13:00:00 10 mL via INTRA_ARTERIAL

## 2019-12-19 MED ORDER — SODIUM CHLORIDE 0.9 % IV SOLN: INTRAVENOUS | Status: DC

## 2019-12-19 MED ORDER — SODIUM CHLORIDE 0.9 % IV SOLN: 250.0000 mL | INTRAVENOUS | Status: DC | PRN

## 2019-12-19 MED ORDER — FENTANYL CITRATE (PF) 100 MCG/2ML IJ SOLN
INTRAMUSCULAR | Status: DC | PRN
  Administered 2019-12-19 (×2): 25 ug via INTRAVENOUS

## 2019-12-19 MED ORDER — FENTANYL CITRATE (PF) 100 MCG/2ML IJ SOLN
INTRAMUSCULAR | Status: AC
  Filled 2019-12-19: qty 2

## 2019-12-19 MED ORDER — MIDAZOLAM HCL 2 MG/2ML IJ SOLN
INTRAMUSCULAR | Status: DC | PRN
  Administered 2019-12-19 (×2): 1 mg via INTRAVENOUS

## 2019-12-19 MED ORDER — HEPARIN (PORCINE) IN NACL 1000-0.9 UT/500ML-% IV SOLN
INTRAVENOUS | Status: AC
  Filled 2019-12-19: qty 1000

## 2019-12-19 MED ORDER — HEPARIN (PORCINE) IN NACL 1000-0.9 UT/500ML-% IV SOLN
INTRAVENOUS | Status: AC
  Filled 2019-12-19: qty 500

## 2019-12-19 MED ORDER — IOHEXOL 350 MG/ML SOLN
INTRAVENOUS | Status: DC | PRN
  Administered 2019-12-19: 50 mL

## 2019-12-19 MED ORDER — LIDOCAINE HCL (PF) 1 % IJ SOLN
INTRAMUSCULAR | Status: DC | PRN
  Administered 2019-12-19 (×2): 2 mL

## 2019-12-19 MED ORDER — VERAPAMIL HCL 2.5 MG/ML IV SOLN
INTRAVENOUS | Status: AC
  Filled 2019-12-19: qty 2

## 2019-12-19 MED ORDER — HEPARIN SODIUM (PORCINE) 1000 UNIT/ML IJ SOLN
INTRAMUSCULAR | Status: AC
  Filled 2019-12-19: qty 1

## 2019-12-19 MED ORDER — HEPARIN (PORCINE) IN NACL 1000-0.9 UT/500ML-% IV SOLN
INTRAVENOUS | Status: DC | PRN
  Administered 2019-12-19: 500 mL

## 2019-12-19 MED ORDER — LIDOCAINE HCL (PF) 1 % IJ SOLN
INTRAMUSCULAR | Status: AC
  Filled 2019-12-19: qty 30

## 2019-12-19 MED ORDER — ASPIRIN 81 MG PO CHEW: 81.0000 mg | CHEWABLE_TABLET | ORAL | Status: DC

## 2019-12-19 MED ORDER — MIDAZOLAM HCL 2 MG/2ML IJ SOLN
INTRAMUSCULAR | Status: AC
  Filled 2019-12-19: qty 2

## 2019-12-19 SURGICAL SUPPLY — 11 items
CATH 5FR JL3.5 JR4 ANG PIG MP (CATHETERS) ×1 IMPLANT
CATH BALLN WEDGE 5F 110CM (CATHETERS) ×1 IMPLANT
DEVICE RAD COMP TR BAND LRG (VASCULAR PRODUCTS) ×1 IMPLANT
ELECT DEFIB PAD ADLT CADENCE (PAD) ×1 IMPLANT
GLIDESHEATH SLEND SS 6F .021 (SHEATH) ×1 IMPLANT
GUIDEWIRE INQWIRE 1.5J.035X260 (WIRE) IMPLANT
INQWIRE 1.5J .035X260CM (WIRE) ×2
KIT HEART LEFT (KITS) ×1 IMPLANT
PACK CARDIAC CATHETERIZATION (CUSTOM PROCEDURE TRAY) ×2 IMPLANT
SHEATH GLIDE SLENDER 4/5FR (SHEATH) ×1 IMPLANT
TRANSDUCER W/STOPCOCK (MISCELLANEOUS) ×2 IMPLANT

## 2019-12-19 NOTE — Discharge Instructions (Signed)
Radial Site Care  This sheet gives you information about how to care for yourself after your procedure. Your health care provider may also give you more specific instructions. If you have problems or questions, contact your health care provider. What can I expect after the procedure? After the procedure, it is common to have:  Bruising and tenderness at the catheter insertion area. Follow these instructions at home: Medicines  Take over-the-counter and prescription medicines only as told by your health care provider. Insertion site care  Follow instructions from your health care provider about how to take care of your insertion site. Make sure you: ? Wash your hands with soap and water before you change your bandage (dressing). If soap and water are not available, use hand sanitizer. ? Change your dressing as told by your health care provider. ? Leave stitches (sutures), skin glue, or adhesive strips in place. These skin closures may need to stay in place for 2 weeks or longer. If adhesive strip edges start to loosen and curl up, you may trim the loose edges. Do not remove adhesive strips completely unless your health care provider tells you to do that.  Check your insertion site every day for signs of infection. Check for: ? Redness, swelling, or pain. ? Fluid or blood. ? Pus or a bad smell. ? Warmth.  Do not take baths, swim, or use a hot tub until your health care provider approves.  You may shower 24-48 hours after the procedure, or as directed by your health care provider. ? Remove the dressing and gently wash the site with plain soap and water. ? Pat the area dry with a clean towel. ? Do not rub the site. That could cause bleeding.  Do not apply powder or lotion to the site. Activity   For 24 hours after the procedure, or as directed by your health care provider: ? Do not flex or bend the affected arm. ? Do not push or pull heavy objects with the affected arm. ? Do not  drive yourself home from the hospital or clinic. You may drive 24 hours after the procedure unless your health care provider tells you not to. ? Do not operate machinery or power tools.  Do not lift anything that is heavier than 10 lb (4.5 kg), or the limit that you are told, until your health care provider says that it is safe.  Ask your health care provider when it is okay to: ? Return to work or school. ? Resume usual physical activities or sports. ? Resume sexual activity. General instructions  If the catheter site starts to bleed, raise your arm and put firm pressure on the site. If the bleeding does not stop, get help right away. This is a medical emergency.  If you went home on the same day as your procedure, a responsible adult should be with you for the first 24 hours after you arrive home.  Keep all follow-up visits as told by your health care provider. This is important. Contact a health care provider if:  You have a fever.  You have redness, swelling, or yellow drainage around your insertion site. Get help right away if:  You have unusual pain at the radial site.  The catheter insertion area swells very fast.  The insertion area is bleeding, and the bleeding does not stop when you hold steady pressure on the area.  Your arm or hand becomes pale, cool, tingly, or numb. These symptoms may represent a serious problem   that is an emergency. Do not wait to see if the symptoms will go away. Get medical help right away. Call your local emergency services (911 in the [place] Do not drive yourself to the hospital. Summary  After the procedure, it is common to have bruising and tenderness at the site.  Follow instructions from your health care provider about how to take care of your radial site wound. Check the wound every day for signs of infection.  Do not lift anything that is heavier than 10 lb (4.5 kg), or the limit that you are told, until your health care provider says  that it is safe. This information is not intended to replace advice given to you by your health care provider. Make sure you discuss any questions you have with your health care provider. Document Revised: 11/17/2017 Document Reviewed: 11/17/2017 Elsevier Patient Education  2020 Elsevier Inc.  

## 2019-12-19 NOTE — Research (Signed)
[place] Informed Consent   Subject Name: Bryce Cruz  Subject met inclusion and exclusion criteria.  The informed consent form, study requirements and expectations were reviewed with the subject and questions and concerns were addressed prior to the signing of the consent form.  The subject verbalized understanding of the trail requirements.  The subject agreed to participate in the [facility] trial and signed the informed consent.  The informed consent was obtained prior to performance of any protocol-specific procedures for the subject.  A copy of the signed informed consent was given to the subject and a copy was placed in the subject's medical record.  [name] 12/19/2019, 1055

## 2019-12-19 NOTE — Interval H&P Note (Signed)
History and Physical Interval Note:  12/19/2019 1:11 PM  Bryce Cruz  has presented today for surgery, with the diagnosis of heart failure.  The various methods of treatment have been discussed with the patient and family. After consideration of risks, benefits and other options for treatment, the patient has consented to  Procedure(s): RIGHT/LEFT HEART CATH AND CORONARY ANGIOGRAPHY (N/A) and possible coronary angioplasty as a surgical intervention.  The patient's history has been reviewed, patient examined, no change in status, stable for surgery.  I have reviewed the patient's chart and labs.  Questions were answered to the patient's satisfaction.     Jaleia Hanke

## 2019-12-20 MED FILL — Heparin Sod (Porcine)-NaCl IV Soln 1000 Unit/500ML-0.9%: INTRAVENOUS | Qty: 1000 | Status: AC

## 2019-12-27 DIAGNOSIS — I493 Ventricular premature depolarization: Secondary | ICD-10-CM | POA: Diagnosis not present

## 2020-01-02 NOTE — Addendum Note (Signed)
Encounter addended by: Micki Riley, RN on: 01/02/2020 3:11 PM  Actions taken: Imaging Exam ended

## 2020-01-16 DIAGNOSIS — E559 Vitamin D deficiency, unspecified: Secondary | ICD-10-CM | POA: Diagnosis not present

## 2020-01-16 DIAGNOSIS — I10 Essential (primary) hypertension: Secondary | ICD-10-CM | POA: Diagnosis not present

## 2020-01-16 DIAGNOSIS — D649 Anemia, unspecified: Secondary | ICD-10-CM | POA: Diagnosis not present

## 2020-01-16 DIAGNOSIS — G4700 Insomnia, unspecified: Secondary | ICD-10-CM | POA: Diagnosis not present

## 2020-01-16 DIAGNOSIS — E039 Hypothyroidism, unspecified: Secondary | ICD-10-CM | POA: Diagnosis not present

## 2020-01-16 DIAGNOSIS — I509 Heart failure, unspecified: Secondary | ICD-10-CM | POA: Diagnosis not present

## 2020-02-20 DIAGNOSIS — N401 Enlarged prostate with lower urinary tract symptoms: Secondary | ICD-10-CM | POA: Diagnosis not present

## 2020-02-20 DIAGNOSIS — R339 Retention of urine, unspecified: Secondary | ICD-10-CM | POA: Diagnosis not present

## 2020-02-24 ENCOUNTER — Other Ambulatory Visit (HOSPITAL_COMMUNITY): Payer: Self-pay | Admitting: Internal Medicine

## 2020-04-14 NOTE — Progress Notes (Signed)
Advanced Heart Failure Clinic Note    Date:  04/14/2020   ID:  Bryce, Cruz 10/11/1946, MRN [id number]  Location: Home  Provider location: [place] [facility] Type of Visit: New consult from Dr. Fredricka Bonine  PCP:  Bryce Sanes, MD  Cardiologist:  No primary care provider on file. Primary HF: New- Bryce Cruz  Chief Complaint: Heart Failure follow-up   History of Present Illness:  74 y/o male with h/o colon cancer (resected 12/20. No chemo).  Admitted to [facility] 02/18/19  with acute HF and orthopnea. EF found to be 20% by echo. Also found to have LBBB.   Feeling very good. Doing whatever he wants. No CP or SOB. Felt weak with lasix so stopped for a few days and now restarted. Says PVCs have improved on BP cuff after starting carvedilol    Cath 2/21:  1. Mild non-obstructive CAD 2. 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 Well-compensated hemodynamics with low filling pressures  Zio 2/21: 1. NSR - min HR of 39 bpm, avg HR of 67 bpm. 2. Two runs of NSVT  - the longest lasting 8 beats with an avg rate of 105 bpm.  3. 18 runs of SVT - the longest lasting 14.2 secs with an avg rate of 99 bpm. 4. Frequent PACs (5.3%, 34831)  5, Frequent PVCs (6.2%, 80321) with two priamry morphologies (5.0% and 1.2% respectively)  6. Difficulty discerning atrial activity making definitive diagnosis difficult to ascertain.   PMHx 1. LBBB (unknown duration) 2. Colon cancer s/p resection  No outpatient medications have been marked as taking for the 04/15/20 encounter [facility] Encounter) with Bryce Cruz, Shaune Pascal, MD.    Allergies:  NKDA  Social History:  Occasional tobacco. No ETOH   Family History:  Mom - CAD s/p CABG deceased Dad - died from SCD (heavy smoker and drinker) Sister with PPM Younger sister is disabled  Vitals:   04/15/20 0907  BP: 120/70  Pulse: 67  SpO2: 98%     Exam:   General:  Well appearing. No resp  difficulty HEENT: normal Neck: supple. no JVD. Carotids 2+ bilat; no bruits. No lymphadenopathy or thryomegaly appreciated. Cor: PMI nondisplaced. Regular rate & rhythm. + ectopy No rubs, gallops or murmurs. Lungs: clear Abdomen: soft, nontender, nondistended. No hepatosplenomegaly. No bruits or masses. Good bowel sounds. Extremities: no cyanosis, clubbing, rash, edema Neuro: alert & orientedx3, cranial nerves grossly intact. moves all 4 extremities w/o difficulty. Affect pleasant   Recent Labs: 12/13/2019: BUN 30; Creatinine, Ser 1.15; Platelets 197 12/19/2019: Hemoglobin 11.2; Hemoglobin 12.2; Potassium 3.4; Potassium 4.1; Sodium 147; Sodium 143  Personally reviewed   Wt Readings from Last 3 Encounters:  12/19/19 72.6 kg (160 lb)  12/13/19 72.4 kg (159 lb 9.6 oz)    ECG: NSR 69 LBBB (155ms) frequent PVCs. Personally reviewed   ASSESSMENT AND PLAN:  1.  Chronic systolic HF due to NICM - new diagnosis 4/20 - Cath 4/21. Minimal CAD. EF 20-25% - Etiology unclear. Possibly LBBB-CM (was told his ECG was a bit abnormal prior to colon cancer surgery) vs frequent PVCs - Volume status ok. NYHA I - Continue spiro 12.5 - Change losartan 50 daily to Entresto 24/26 bid - Change lasix to PRN only  - Needs cMRI to further evaluate.  - Discussed possible need for CRT in future given suspicion for LBBB CM (vs PVC).  - Will add amio and try to suppress PVCs for 2 months. Repeat echo in 2  months if EF still down refer to EP fo possible CRT - Labs today  2. Colon CA - s/p resection  - stable in remission - refused chemo  3. Frequent PVCs - results as above 6.2% PVCs - start amio as above   Signed, Glori Bickers, MD  04/14/2020 4:38 PM  Advanced Heart Failure [place] [address] [facility] and [place] [address] [phone] (office) [phone] (fax)

## 2020-04-15 ENCOUNTER — Encounter (HOSPITAL_COMMUNITY): Payer: Self-pay | Admitting: Internal Medicine

## 2020-04-15 ENCOUNTER — Telehealth (HOSPITAL_COMMUNITY): Payer: Self-pay | Admitting: Pharmacy Technician

## 2020-04-15 ENCOUNTER — Ambulatory Visit (HOSPITAL_COMMUNITY)
Admission: RE | Admit: 2020-04-15 | Discharge: 2020-04-15 | Disposition: A | Discharge status: Home / Self Care | Payer: PPO | Source: Ambulatory Visit | Attending: Internal Medicine | Admitting: Internal Medicine

## 2020-04-15 ENCOUNTER — Other Ambulatory Visit: Payer: Self-pay

## 2020-04-15 VITALS — BP 120/70 | HR 67 | Wt 162.0 lb

## 2020-04-15 DIAGNOSIS — Z85038 Personal history of other malignant neoplasm of large intestine: Secondary | ICD-10-CM | POA: Insufficient documentation

## 2020-04-15 DIAGNOSIS — I5022 Chronic systolic (congestive) heart failure: Secondary | ICD-10-CM | POA: Diagnosis not present

## 2020-04-15 DIAGNOSIS — C189 Malignant neoplasm of colon, unspecified: Secondary | ICD-10-CM | POA: Insufficient documentation

## 2020-04-15 DIAGNOSIS — Z8249 Family history of ischemic heart disease and other diseases of the circulatory system: Secondary | ICD-10-CM | POA: Insufficient documentation

## 2020-04-15 DIAGNOSIS — I471 Supraventricular tachycardia: Secondary | ICD-10-CM | POA: Diagnosis not present

## 2020-04-15 DIAGNOSIS — I493 Ventricular premature depolarization: Secondary | ICD-10-CM | POA: Diagnosis not present

## 2020-04-15 DIAGNOSIS — I2510 Atherosclerotic heart disease of native coronary artery without angina pectoris: Secondary | ICD-10-CM | POA: Insufficient documentation

## 2020-04-15 DIAGNOSIS — I428 Other cardiomyopathies: Secondary | ICD-10-CM | POA: Diagnosis not present

## 2020-04-15 DIAGNOSIS — I5042 Chronic combined systolic (congestive) and diastolic (congestive) heart failure: Secondary | ICD-10-CM

## 2020-04-15 LAB — COMPREHENSIVE METABOLIC PANEL
ALT: 30 U/L (ref 0–44)
AST: 29 U/L (ref 15–41)
Albumin: 3.9 g/dL (ref 3.5–5.0)
Alkaline Phosphatase: 84 U/L (ref 38–126)
Anion gap: 7 (ref 5–15)
BUN: 23 mg/dL (ref 8–23)
CO2: 19 mmol/L — ABNORMAL LOW (ref 22–32)
Calcium: 9.1 mg/dL (ref 8.9–10.3)
Chloride: 113 mmol/L — ABNORMAL HIGH (ref 98–111)
Creatinine, Ser: 1.28 mg/dL — ABNORMAL HIGH (ref 0.61–1.24)
GFR calc Af Amer: >60 mL/min (ref >60)
GFR calc non Af Amer: 55 mL/min — ABNORMAL LOW (ref >60)
Glucose, Bld: 104 mg/dL — ABNORMAL HIGH (ref 70–99)
Potassium: 4.1 mmol/L (ref 3.5–5.1)
Sodium: 139 mmol/L (ref 135–145)
Total Bilirubin: 0.8 mg/dL (ref 0.3–1.2)
Total Protein: 6 g/dL — ABNORMAL LOW (ref 6.5–8.1)

## 2020-04-15 LAB — BRAIN NATRIURETIC PEPTIDE: B Natriuretic Peptide: 501.3 pg/mL — ABNORMAL HIGH (ref 0.0–100.0)

## 2020-04-15 MED ORDER — FUROSEMIDE 40 MG PO TABS: 40.0000 mg | ORAL_TABLET | Freq: Every day | ORAL | Status: AC | PRN

## 2020-04-15 MED ORDER — ENTRESTO 24-26 MG PO TABS: 1.0000 | ORAL_TABLET | Freq: Two times a day (BID) | ORAL | 3 refills | Status: DC

## 2020-04-15 MED ORDER — AMIODARONE HCL 200 MG PO TABS: 200.0000 mg | ORAL_TABLET | Freq: Every day | ORAL | 6 refills | Status: DC

## 2020-04-15 MED ORDER — POTASSIUM CHLORIDE CRYS ER 20 MEQ PO TBCR: 20.0000 meq | EXTENDED_RELEASE_TABLET | Freq: Every day | ORAL | 3 refills | Status: DC | PRN

## 2020-04-15 NOTE — Patient Instructions (Signed)
Stop Losartan  Start Entresto 24/26 mg Twice daily   Start Amiodarone 200 mg Daily  ONLY TAKE Furosemide and Potassium AS NEEDED  Labs done today, we will notify you for abnormal results  Your physician has requested that you have a cardiac MRI. Cardiac MRI uses a computer to create images of your heart as its beating, producing both still and moving pictures of your heart and major blood vessels. For further information please visit [email or web address] Please follow the instruction sheet given to you today for more information. Once approved by your insurance company you will be contacted to schedule this  Your physician recommends that you schedule a follow-up appointment in: 2 months with echocardiogram  If you have any questions or concerns before your next appointment please send [place] a message through [place] or call our office at [phone].    TO LEAVE A MESSAGE FOR THE NURSE SELECT OPTION 2, PLEASE LEAVE A MESSAGE INCLUDING: . YOUR NAME . DATE OF BIRTH . CALL BACK NUMBER . REASON FOR CALL**this is important as we prioritize the call backs  [place] AS LONG AS YOU CALL BEFORE 4:00 PM  At the [facility], you and your health needs are our priority. As part of our continuing mission to provide you with exceptional heart care, we have created designated Provider Care Teams. These Care Teams include your primary Cardiologist (physician) and Advanced Practice Providers (APPs- Physician Assistants and Nurse Practitioners) who all work together to provide you with the care you need, when you need it.   You may see any of the following providers on your designated Care Team at your next follow up: Marland Kitchen Dr Glori Bickers . Dr Loralie Champagne . Darrick Grinder, NP . Lyda Jester, PA . Audry Riles, PharmD   Please be sure to bring in all your medications bottles to every appointment.

## 2020-04-15 NOTE — Telephone Encounter (Signed)
Patient Advocate Encounter   Received notification from [employer] that prior authorization for [name] is required.   PA submitted on CoverMyMeds [id number] Status is pending   Will continue to follow.

## 2020-04-16 NOTE — Telephone Encounter (Signed)
Advanced Heart Failure Patient Advocate Encounter  Prior Authorization for [name] has been approved.    Effective dates: 04/15/20 through 04/15/21  Patients co-pay is $45.00  Charlann Boxer, CPhT

## 2020-04-24 NOTE — Telephone Encounter (Signed)
Called and left patient a message to make sure his [name] co-pay was affordable.  Will be here to assist if not.  Charlann Boxer, CPhT

## 2020-06-24 NOTE — Progress Notes (Signed)
Advanced Heart Failure Clinic Note    Date:  06/25/2020   ID:  Bryce Cruz, Bryce Cruz 17-Jul-1946, MRN [id number]  Location: Home  Provider location: [place] [facility] Type of Visit: New consult from Dr. Fredricka Cruz  PCP:  Bryce Sanes, MD  Cardiologist:  No primary care provider on file. Primary HF: New- Bryce Cruz  Chief Complaint: Heart Failure follow-up   History of Present Illness:  [name] is a 74 y/o male with h/o colon cancer (resected 12/20. No chemo).  Admitted to [facility] 02/18/19  with acute HF and orthopnea. EF found to be 20% by echo. Also found to have LBBB.   At last visit started on amio to suppress PVCs given concern over PVC cardiomyopathy  Here for f/u. Says amio made him feel funny for a few days but now he is used to it. Working hard in the sun. No CP, SOB or palpitations. SBP 120-130s at home   Cath 2/21:  1. Mild non-obstructive CAD 2. 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 Well-compensated hemodynamics with low filling pressures  Zio 2/21: 1. NSR - min HR of 39 bpm, avg HR of 67 bpm. 2. Two runs of NSVT  - the longest lasting 8 beats with an avg rate of 105 bpm.  3. 18 runs of SVT - the longest lasting 14.2 secs with an avg rate of 99 bpm. 4. Frequent PACs (5.3%, 34831)  5, Frequent PVCs (6.2%, 54270) with two priamry morphologies (5.0% and 1.2% respectively)  6. Difficulty discerning atrial activity making definitive diagnosis difficult to ascertain.   PMHx 1. LBBB (unknown duration) 2. Colon cancer s/p resection  Current Meds  Medication Sig  . alfuzosin (UROXATRAL) 10 MG 24 hr tablet Take 10 mg by mouth daily as needed.   Marland Kitchen amiodarone (PACERONE) 200 MG tablet Take 1 tablet (200 mg total) by mouth daily.  Marland Kitchen aspirin EC 81 MG tablet Take 81 mg by mouth daily.  . carvedilol (COREG) 3.125 MG tablet Take 1 tablet (3.125 mg total) by mouth 2 (two) times daily.  . fluticasone (FLONASE) 50 MCG/ACT nasal  spray Place 1 spray into both nostrils daily as needed for allergies or rhinitis.  . furosemide (LASIX) 40 MG tablet Take 1 tablet (40 mg total) by mouth daily as needed.  . Multiple Vitamin (MULTIVITAMIN WITH MINERALS) TABS tablet Take 1 tablet by mouth daily.  . potassium chloride SA (KLOR-CON) 20 MEQ tablet Take 1 tablet (20 mEq total) by mouth daily as needed.  . sacubitril-valsartan (ENTRESTO) 24-26 MG Take 1 tablet by mouth 2 (two) times daily.  Marland Kitchen zolpidem (AMBIEN) 5 MG tablet Take 2.5 mg by mouth at bedtime as needed for sleep.     Allergies:  NKDA  Social History:  Occasional tobacco. No ETOH   Family History:  Mom - CAD s/p CABG deceased Dad - died from SCD (heavy smoker and drinker) Sister with PPM Younger sister is disabled  Vitals:   06/25/20 1001  BP: (!) 156/78  Pulse: (!) 54  SpO2: 97%     Exam:   General:  Well appearing. No resp difficulty HEENT: normal Neck: supple. no JVD. Carotids 2+ bilat; no bruits. No lymphadenopathy or thryomegaly appreciated. Cor: PMI nondisplaced. Regular rate & rhythm. No rubs, gallops or murmurs. Lungs: clear Abdomen: soft, nontender, nondistended. No hepatosplenomegaly. No bruits or masses. Good bowel sounds. Extremities: no cyanosis, clubbing, rash, edema Neuro: alert & orientedx3, cranial nerves grossly intact. moves all 4  extremities w/o difficulty. Affect pleasant   Recent Labs: 12/13/2019: Platelets 197 12/19/2019: Hemoglobin 11.2; Hemoglobin 12.2 04/15/2020: ALT 30; B Natriuretic Peptide 501.3; BUN 23; Creatinine, Ser 1.28; Potassium 4.1; Sodium 139  Personally reviewed   Wt Readings from Last 3 Encounters:  06/25/20 74 kg (163 lb 4 oz)  04/15/20 73.5 kg (162 lb)  12/19/19 72.6 kg (160 lb)    ECG: NSR 69 LBBB (179ms) frequent PVCs. Personally reviewed   ASSESSMENT AND PLAN:  1.  Chronic systolic HF due to NICM - new diagnosis 4/20 - Cath 4/21. Minimal CAD. EF 20-25% - Etiology unclear. Possibly LBBB-CM (was told  his ECG was a bit abnormal prior to colon cancer surgery) vs frequent PVCs - PVCs suppressed with amio but EF still 25% on echo today with marked dyssynchrony (06/25/20) -> suspect LBBB CM - Volume status ok. NYHA I-II - Continue spiro 12.5 - Increase Entresto 49/51 bid - Continue lasix PRN only  - No b-blocker with bradycardia currently - Can consider SGLT2i down the road after we titrate Entresto  - cMRI ordered to further evaluate - scheduled for 9/29 .  - Refer to EP for CRT given LBBB CM - Labs today  2. Colon CA - s/p resection  - stable in remission - refused chemo  3. Frequent PVCs - results as above 6.2% PVCs - On amio. PVCs suppressed on exam  - ECGs pending   Signed, Bryce Bickers, MD  06/25/2020 10:29 AM  Advanced Heart Failure [place] [address] [place] [address] [phone] (office) [phone] (fax)

## 2020-06-25 ENCOUNTER — Ambulatory Visit (HOSPITAL_BASED_OUTPATIENT_CLINIC_OR_DEPARTMENT_OTHER)
Admission: RE | Admit: 2020-06-25 | Discharge: 2020-06-25 | Disposition: A | Discharge status: Home / Self Care | Payer: PPO | Source: Ambulatory Visit | Attending: Internal Medicine | Admitting: Internal Medicine

## 2020-06-25 ENCOUNTER — Ambulatory Visit (HOSPITAL_COMMUNITY)
Admission: RE | Admit: 2020-06-25 | Discharge: 2020-06-25 | Disposition: A | Discharge status: Home / Self Care | Payer: PPO | Source: Ambulatory Visit | Attending: Internal Medicine | Admitting: Internal Medicine

## 2020-06-25 ENCOUNTER — Other Ambulatory Visit: Payer: Self-pay

## 2020-06-25 VITALS — BP 156/78 | HR 54 | Wt 163.2 lb

## 2020-06-25 DIAGNOSIS — I351 Nonrheumatic aortic (valve) insufficiency: Secondary | ICD-10-CM | POA: Insufficient documentation

## 2020-06-25 DIAGNOSIS — Z7982 Long term (current) use of aspirin: Secondary | ICD-10-CM | POA: Diagnosis not present

## 2020-06-25 DIAGNOSIS — I471 Supraventricular tachycardia: Secondary | ICD-10-CM | POA: Insufficient documentation

## 2020-06-25 DIAGNOSIS — I493 Ventricular premature depolarization: Secondary | ICD-10-CM | POA: Insufficient documentation

## 2020-06-25 DIAGNOSIS — I509 Heart failure, unspecified: Secondary | ICD-10-CM

## 2020-06-25 DIAGNOSIS — I447 Left bundle-branch block, unspecified: Secondary | ICD-10-CM

## 2020-06-25 DIAGNOSIS — Z8249 Family history of ischemic heart disease and other diseases of the circulatory system: Secondary | ICD-10-CM | POA: Diagnosis not present

## 2020-06-25 DIAGNOSIS — Z85038 Personal history of other malignant neoplasm of large intestine: Secondary | ICD-10-CM | POA: Diagnosis not present

## 2020-06-25 DIAGNOSIS — I428 Other cardiomyopathies: Secondary | ICD-10-CM | POA: Insufficient documentation

## 2020-06-25 DIAGNOSIS — Z79899 Other long term (current) drug therapy: Secondary | ICD-10-CM | POA: Diagnosis not present

## 2020-06-25 DIAGNOSIS — I2510 Atherosclerotic heart disease of native coronary artery without angina pectoris: Secondary | ICD-10-CM | POA: Diagnosis not present

## 2020-06-25 DIAGNOSIS — I5042 Chronic combined systolic (congestive) and diastolic (congestive) heart failure: Secondary | ICD-10-CM

## 2020-06-25 DIAGNOSIS — Z7901 Long term (current) use of anticoagulants: Secondary | ICD-10-CM | POA: Insufficient documentation

## 2020-06-25 DIAGNOSIS — I5022 Chronic systolic (congestive) heart failure: Secondary | ICD-10-CM | POA: Insufficient documentation

## 2020-06-25 LAB — COMPREHENSIVE METABOLIC PANEL
ALT: 29 U/L (ref 0–44)
AST: 30 U/L (ref 15–41)
Albumin: 3.7 g/dL (ref 3.5–5.0)
Alkaline Phosphatase: 72 U/L (ref 38–126)
Anion gap: 9 (ref 5–15)
BUN: 21 mg/dL (ref 8–23)
CO2: 22 mmol/L (ref 22–32)
Calcium: 9 mg/dL (ref 8.9–10.3)
Chloride: 109 mmol/L (ref 98–111)
Creatinine, Ser: 1.23 mg/dL (ref 0.61–1.24)
GFR calc Af Amer: >60 mL/min (ref >60)
GFR calc non Af Amer: 57 mL/min — ABNORMAL LOW (ref >60)
Glucose, Bld: 109 mg/dL — ABNORMAL HIGH (ref 70–99)
Potassium: 4.6 mmol/L (ref 3.5–5.1)
Sodium: 140 mmol/L (ref 135–145)
Total Bilirubin: 0.5 mg/dL (ref 0.3–1.2)
Total Protein: 5.9 g/dL — ABNORMAL LOW (ref 6.5–8.1)

## 2020-06-25 LAB — ECHOCARDIOGRAM COMPLETE
Area-P 1/2: 4.21 cm2
S' Lateral: 4.4 cm

## 2020-06-25 LAB — T4, FREE: Free T4: 0.72 ng/dL (ref 0.61–1.12)

## 2020-06-25 LAB — TSH: TSH: 11.573 u[IU]/mL — ABNORMAL HIGH (ref 0.350–4.500)

## 2020-06-25 LAB — BRAIN NATRIURETIC PEPTIDE: B Natriuretic Peptide: 480.5 pg/mL — ABNORMAL HIGH (ref 0.0–100.0)

## 2020-06-25 MED ORDER — ENTRESTO 49-51 MG PO TABS: 1.0000 | ORAL_TABLET | Freq: Two times a day (BID) | ORAL | 4 refills | Status: DC

## 2020-06-25 NOTE — Progress Notes (Signed)
°  Echocardiogram 2D Echocardiogram has been performed.  Bryce Cruz 06/25/2020, 9:35 AM

## 2020-06-25 NOTE — Patient Instructions (Signed)
INCREASE Entresto 49/51 Twice Daily  Labs done today, your results will be available in MyChart, we will contact you for abnormal readings.  Follow up with our clinic in 3-4 months  You have been referred to Dr. Lovena Le, they will contact you to schedule an appointment.   If you have any questions or concerns before your next appointment please send [place] a message through [place] or call our office at [phone].    TO LEAVE A MESSAGE FOR THE NURSE SELECT OPTION 2, PLEASE LEAVE A MESSAGE INCLUDING: . YOUR NAME . DATE OF BIRTH . CALL BACK NUMBER . REASON FOR CALL**this is important as we prioritize the call backs  [place] AS LONG AS YOU CALL BEFORE 4:00 PM  At the [facility], you and your health needs are our priority. As part of our continuing mission to provide you with exceptional heart care, we have created designated Provider Care Teams. These Care Teams include your primary Cardiologist (physician) and Advanced Practice Providers (APPs- Physician Assistants and Nurse Practitioners) who all work together to provide you with the care you need, when you need it.   You may see any of the following providers on your designated Care Team at your next follow up: Marland Kitchen Dr Glori Bickers . Dr Loralie Champagne . Darrick Grinder, NP . Lyda Jester, PA . Audry Riles, PharmD   Please be sure to bring in all your medications bottles to every appointment.

## 2020-07-16 DIAGNOSIS — N400 Enlarged prostate without lower urinary tract symptoms: Secondary | ICD-10-CM | POA: Diagnosis not present

## 2020-07-16 DIAGNOSIS — E079 Disorder of thyroid, unspecified: Secondary | ICD-10-CM | POA: Diagnosis not present

## 2020-07-16 DIAGNOSIS — I509 Heart failure, unspecified: Secondary | ICD-10-CM | POA: Diagnosis not present

## 2020-07-16 DIAGNOSIS — E538 Deficiency of other specified B group vitamins: Secondary | ICD-10-CM | POA: Diagnosis not present

## 2020-07-16 DIAGNOSIS — E612 Magnesium deficiency: Secondary | ICD-10-CM | POA: Diagnosis not present

## 2020-07-16 DIAGNOSIS — G4700 Insomnia, unspecified: Secondary | ICD-10-CM | POA: Diagnosis not present

## 2020-07-16 DIAGNOSIS — Z0001 Encounter for general adult medical examination with abnormal findings: Secondary | ICD-10-CM | POA: Diagnosis not present

## 2020-07-16 DIAGNOSIS — Z0000 Encounter for general adult medical examination without abnormal findings: Secondary | ICD-10-CM | POA: Diagnosis not present

## 2020-07-16 DIAGNOSIS — R739 Hyperglycemia, unspecified: Secondary | ICD-10-CM | POA: Diagnosis not present

## 2020-07-16 DIAGNOSIS — E876 Hypokalemia: Secondary | ICD-10-CM | POA: Diagnosis not present

## 2020-07-16 DIAGNOSIS — Z125 Encounter for screening for malignant neoplasm of prostate: Secondary | ICD-10-CM | POA: Diagnosis not present

## 2020-07-16 DIAGNOSIS — R7301 Impaired fasting glucose: Secondary | ICD-10-CM | POA: Diagnosis not present

## 2020-07-18 ENCOUNTER — Telehealth (HOSPITAL_COMMUNITY): Payer: Self-pay | Admitting: Emergency Medicine

## 2020-07-18 NOTE — Telephone Encounter (Signed)
Reaching out to patient to offer assistance regarding upcoming cardiac imaging study; pt verbalizes understanding of appt date/time, parking situation and where to check in, pre-test NPO status and medications ordered, and verified current allergies; name and call back number provided for further questions should they arise Candus Braud RN Navigator Cardiac Imaging [facility] [phone] office [phone] cell  Denies implants, denies claustro  

## 2020-07-22 ENCOUNTER — Ambulatory Visit (HOSPITAL_COMMUNITY)
Admission: RE | Admit: 2020-07-22 | Discharge: 2020-07-22 | Disposition: A | Discharge status: Home / Self Care | Payer: PPO | Source: Ambulatory Visit | Attending: Internal Medicine | Admitting: Internal Medicine

## 2020-07-22 ENCOUNTER — Other Ambulatory Visit: Payer: Self-pay

## 2020-07-22 DIAGNOSIS — I509 Heart failure, unspecified: Secondary | ICD-10-CM | POA: Diagnosis not present

## 2020-07-22 MED ORDER — GADOBUTROL 1 MMOL/ML IV SOLN
10.0000 mL | Freq: Once | INTRAVENOUS | Status: AC | PRN
  Administered 2020-07-22: 10 mL via INTRAVENOUS

## 2020-07-29 ENCOUNTER — Telehealth (HOSPITAL_COMMUNITY): Payer: Self-pay

## 2020-07-29 NOTE — Telephone Encounter (Signed)
-----   Message from Jolaine Artist, MD sent at 07/27/2020  5:14 PM EDT ----- EF 29%. No scar. There is dyssynchrony. Hopefully will get better with CRT device (already referred to EP)

## 2020-07-29 NOTE — Telephone Encounter (Signed)
Malena Edman, RN  07/29/2020 4:46 PM EDT Back to Top    Patient advised and verbalized understanding.

## 2020-08-06 ENCOUNTER — Ambulatory Visit: Payer: PPO | Admitting: Internal Medicine

## 2020-08-06 ENCOUNTER — Other Ambulatory Visit: Payer: Self-pay

## 2020-08-06 ENCOUNTER — Encounter: Payer: Self-pay | Admitting: Internal Medicine

## 2020-08-06 DIAGNOSIS — I428 Other cardiomyopathies: Secondary | ICD-10-CM | POA: Insufficient documentation

## 2020-08-06 DIAGNOSIS — I447 Left bundle-branch block, unspecified: Secondary | ICD-10-CM | POA: Insufficient documentation

## 2020-08-06 NOTE — H&P (View-Only) (Signed)
HPI [name] is referred by Dr. Reine Just for consideration for BIV device insertion. He is a pleasant 74 yo man with chronic systolic heart failure initially diagnosed on 4/20. He has been treated with maximal medical therapy. He has less than 10% PVC's on a cardiac monitor. He has not had syncope. He has class 2 symptoms. He still works part time. He has been on amiodarone.  No Known Allergies   Current Outpatient Medications  Medication Sig Dispense Refill  . alfuzosin (UROXATRAL) 10 MG 24 hr tablet Take 10 mg by mouth daily as needed.     Marland Kitchen amiodarone (PACERONE) 200 MG tablet Take 1 tablet (200 mg total) by mouth daily. 30 tablet 6  . aspirin EC 81 MG tablet Take 81 mg by mouth daily.    . carvedilol (COREG) 3.125 MG tablet Take 1 tablet (3.125 mg total) by mouth 2 (two) times daily. 60 tablet 11  . fluticasone (FLONASE) 50 MCG/ACT nasal spray Place 1 spray into both nostrils daily as needed for allergies or rhinitis.    . furosemide (LASIX) 40 MG tablet Take 1 tablet (40 mg total) by mouth daily as needed. 30 tablet   . Multiple Vitamin (MULTIVITAMIN WITH MINERALS) TABS tablet Take 1 tablet by mouth daily.    . potassium chloride SA (KLOR-CON) 20 MEQ tablet Take 1 tablet (20 mEq total) by mouth daily as needed. 90 tablet 3  . sacubitril-valsartan (ENTRESTO) 49-51 MG Take 1 tablet by mouth 2 (two) times daily. 60 tablet 4  . zolpidem (AMBIEN) 5 MG tablet Take 2.5 mg by mouth at bedtime as needed for sleep.      No current facility-administered medications for this visit.     History reviewed. No pertinent past medical history.  ROS:   All systems reviewed and negative except as noted in the HPI.   Past Surgical History:  Procedure Laterality Date  . RIGHT/LEFT HEART CATH AND CORONARY ANGIOGRAPHY N/A 12/19/2019   Procedure: RIGHT/LEFT HEART CATH AND CORONARY ANGIOGRAPHY;  Surgeon: Jolaine Artist, MD;  Location: [facility];  Service: Cardiovascular;  Laterality:  N/A;     History reviewed. No pertinent family history.   Social History   Socioeconomic History  . Marital status: Single    Spouse name: Not on file  . Number of children: Not on file  . Years of education: Not on file  . Highest education level: Not on file  Occupational History  . Not on file  Tobacco Use  . Smoking status: Light Tobacco Smoker  . Smokeless tobacco: Never Used  . Tobacco comment: social smoker  Substance and Sexual Activity  . Alcohol use: Never  . Drug use: Not on file  . Sexual activity: Not on file  Other Topics Concern  . Not on file  Social History Narrative  . Not on file   Social Determinants of Health   Financial Resource Strain:   . Difficulty of Paying Living Expenses: Not on file  Food Insecurity:   . Worried About Charity fundraiser in the Last Year: Not on file  . Ran Out of Food in the Last Year: Not on file  Transportation Needs:   . Lack of Transportation (Medical): Not on file  . Lack of Transportation (Non-Medical): Not on file  Physical Activity:   . Days of Exercise per Week: Not on file  . Minutes of Exercise per Session: Not on file  Stress:   . Feeling  of Stress : Not on file  Social Connections:   . Frequency of Communication with Friends and Family: Not on file  . Frequency of Social Gatherings with Friends and Family: Not on file  . Attends Religious Services: Not on file  . Active Member of Clubs or Organizations: Not on file  . Attends Archivist Meetings: Not on file  . Marital Status: Not on file  Intimate Partner Violence:   . Fear of Current or Ex-Partner: Not on file  . Emotionally Abused: Not on file  . Physically Abused: Not on file  . Sexually Abused: Not on file     BP 130/80   Pulse (!) 56   Ht 5\' 7"  (1.702 m)   Wt 164 lb 6.4 oz (74.6 kg)   SpO2 98%   BMI 25.75 kg/m   Physical Exam:  Well appearing NAD HEENT: Unremarkable Neck:  No JVD, no thyromegally Lymphatics:  No  adenopathy Back:  No CVA tenderness Lungs:  Clear with no wheezes HEART:  Regular rate rhythm, no murmurs, no rubs, no clicks, split S2. Abd:  soft, positive bowel sounds, no organomegally, no rebound, no guarding Ext:  2 plus pulses, no edema, no cyanosis, no clubbing Skin:  No rashes no nodules Neuro:  CN II through XII intact, motor grossly intact  EKG - nsr with LBBB, PVC's.    Assess/Plan: 1. Non-ischemic CM - He has severe LV dysfunction for over 18 months despite guideline directed medical therapy. I have discussed the risks/benefits/goals/expectations of biv ICD insertion and he will call us if he wishes to proceed. 2. PVC's - he has less than 10% and is on amiodarone. Not sure how much of a benfit this is. After device implant we will have a much better indication of his PVC density over time. 3. PVC's - he is on amiodarone. I would hope to discontinue after his device is placed.  Carleene Overlie Jamee Keach,MD

## 2020-08-06 NOTE — Progress Notes (Signed)
HPI Bryce Cruz is referred by Dr. Reine Just for consideration for BIV device insertion. He is a pleasant 74 yo man with chronic systolic heart failure initially diagnosed on 4/20. He has been treated with maximal medical therapy. He has less than 10% PVC's on a cardiac monitor. He has not had syncope. He has class 2 symptoms. He still works part time. He has been on amiodarone.  No Known Allergies   Current Outpatient Medications  Medication Sig Dispense Refill  . alfuzosin (UROXATRAL) 10 MG 24 hr tablet Take 10 mg by mouth daily as needed.     Marland Kitchen amiodarone (PACERONE) 200 MG tablet Take 1 tablet (200 mg total) by mouth daily. 30 tablet 6  . aspirin EC 81 MG tablet Take 81 mg by mouth daily.    . carvedilol (COREG) 3.125 MG tablet Take 1 tablet (3.125 mg total) by mouth 2 (two) times daily. 60 tablet 11  . fluticasone (FLONASE) 50 MCG/ACT nasal spray Place 1 spray into both nostrils daily as needed for allergies or rhinitis.    . furosemide (LASIX) 40 MG tablet Take 1 tablet (40 mg total) by mouth daily as needed. 30 tablet   . Multiple Vitamin (MULTIVITAMIN WITH MINERALS) TABS tablet Take 1 tablet by mouth daily.    . potassium chloride SA (KLOR-CON) 20 MEQ tablet Take 1 tablet (20 mEq total) by mouth daily as needed. 90 tablet 3  . sacubitril-valsartan (ENTRESTO) 49-51 MG Take 1 tablet by mouth 2 (two) times daily. 60 tablet 4  . zolpidem (AMBIEN) 5 MG tablet Take 2.5 mg by mouth at bedtime as needed for sleep.      No current facility-administered medications for this visit.     History reviewed. No pertinent past medical history.  ROS:   All systems reviewed and negative except as noted in the HPI.   Past Surgical History:  Procedure Laterality Date  . RIGHT/LEFT HEART CATH AND CORONARY ANGIOGRAPHY N/A 12/19/2019   Procedure: RIGHT/LEFT HEART CATH AND CORONARY ANGIOGRAPHY;  Surgeon: Jolaine Artist, MD;  Location: [facility];  Service: Cardiovascular;  Laterality:  N/A;     History reviewed. No pertinent family history.   Social History   Socioeconomic History  . Marital status: Single    Spouse name: Not on file  . Number of children: Not on file  . Years of education: Not on file  . Highest education level: Not on file  Occupational History  . Not on file  Tobacco Use  . Smoking status: Light Tobacco Smoker  . Smokeless tobacco: Never Used  . Tobacco comment: social smoker  Substance and Sexual Activity  . Alcohol use: Never  . Drug use: Not on file  . Sexual activity: Not on file  Other Topics Concern  . Not on file  Social History Narrative  . Not on file   Social Determinants of Health   Financial Resource Strain:   . Difficulty of Paying Living Expenses: Not on file  Food Insecurity:   . Worried About Charity fundraiser in the Last Year: Not on file  . Ran Out of Food in the Last Year: Not on file  Transportation Needs:   . Lack of Transportation (Medical): Not on file  . Lack of Transportation (Non-Medical): Not on file  Physical Activity:   . Days of Exercise per Week: Not on file  . Minutes of Exercise per Session: Not on file  Stress:   . Feeling  of Stress : Not on file  Social Connections:   . Frequency of Communication with Friends and Family: Not on file  . Frequency of Social Gatherings with Friends and Family: Not on file  . Attends Religious Services: Not on file  . Active Member of Clubs or Organizations: Not on file  . Attends Archivist Meetings: Not on file  . Marital Status: Not on file  Intimate Partner Violence:   . Fear of Current or Ex-Partner: Not on file  . Emotionally Abused: Not on file  . Physically Abused: Not on file  . Sexually Abused: Not on file     BP 130/80   Pulse (!) 56   Ht 5\' 7"  (1.702 m)   Wt 164 lb 6.4 oz (74.6 kg)   SpO2 98%   BMI 25.75 kg/m   Physical Exam:  Well appearing NAD HEENT: Unremarkable Neck:  No JVD, no thyromegally Lymphatics:  No  adenopathy Back:  No CVA tenderness Lungs:  Clear with no wheezes HEART:  Regular rate rhythm, no murmurs, no rubs, no clicks, split S2. Abd:  soft, positive bowel sounds, no organomegally, no rebound, no guarding Ext:  2 plus pulses, no edema, no cyanosis, no clubbing Skin:  No rashes no nodules Neuro:  CN II through XII intact, motor grossly intact  EKG - nsr with LBBB, PVC's.    Assess/Plan: 1. Non-ischemic CM - He has severe LV dysfunction for over 18 months despite guideline directed medical therapy. I have discussed the risks/benefits/goals/expectations of biv ICD insertion and he will call us if he wishes to proceed. 2. PVC's - he has less than 10% and is on amiodarone. Not sure how much of a benfit this is. After device implant we will have a much better indication of his PVC density over time. 3. PVC's - he is on amiodarone. I would hope to discontinue after his device is placed.  Bryce Overlie Maleko Greulich,MD

## 2020-08-06 NOTE — Patient Instructions (Addendum)
Medication Instructions:  Your physician recommends that you continue on your current medications as directed. Please refer to the Current Medication list given to you today.  Labwork: None ordered.  Testing/Procedures: None ordered.  Follow-Up:  The following dates are available for procedures:  November 2, 8, 15, 18, 22, 29  Any Other Special Instructions Will Be Listed Below (If Applicable).  If you need a refill on your cardiac medications before your next appointment, please call your pharmacy.    Cardioverter Defibrillator Implantation  An implantable cardioverter defibrillator (ICD) is a small device that is placed under the skin in the chest or abdomen. An ICD consists of a battery, a small computer (pulse generator), and wires (leads) that go into the heart. An ICD is used to detect and correct two types of dangerous irregular heartbeats (arrhythmias):  A rapid heart rhythm (tachycardia).  An arrhythmia in which the lower chambers of the heart (ventricles) contract in an uncoordinated way (fibrillation). When an ICD detects tachycardia, it sends a low-energy shock to the heart to restore the heartbeat to normal (cardioversion). This signal is usually painless. If cardioversion does not work or if the ICD detects fibrillation, it delivers a high-energy shock to the heart (defibrillation) to restart the heart. This shock may feel like a strong jolt in the chest. Your health care provider may prescribe an ICD if:  You have had an arrhythmia that originated in the ventricles.  Your heart has been damaged by a disease or heart condition. Sometimes, ICDs are programmed to act as a device called a pacemaker. Pacemakers can be used to treat a slow heartbeat (bradycardia) or tachycardia by taking over the heart rate with electrical impulses. Tell a health care provider about:  Any allergies you have.  All medicines you are taking, including vitamins, herbs, eye drops, creams,  and over-the-counter medicines.  Any problems you or family members have had with anesthetic medicines.  Any blood disorders you have.  Any surgeries you have had.  Any medical conditions you have.  Whether you are pregnant or may be pregnant. What are the risks? Generally, this is a safe procedure. However, problems may occur, including:  Swelling, bleeding, or bruising.  Infection.  Blood clots.  Damage to other structures or organs, such as nerves, blood vessels, or the heart.  Allergic reactions to medicines used during the procedure. What happens before the procedure? Staying hydrated Follow instructions from your health care provider about hydration, which may include:  Up to 2 hours before the procedure - you may continue to drink clear liquids, such as water, clear fruit juice, black coffee, and plain tea. Eating and drinking restrictions Follow instructions from your health care provider about eating and drinking, which may include:  8 hours before the procedure - stop eating heavy meals or foods such as meat, fried foods, or fatty foods.  6 hours before the procedure - stop eating light meals or foods, such as toast or cereal.  6 hours before the procedure - stop drinking milk or drinks that contain milk.  2 hours before the procedure - stop drinking clear liquids. Medicine Ask your health care provider about:  Changing or stopping your normal medicines. This is important if you take diabetes medicines or blood thinners.  Taking medicines such as aspirin and ibuprofen. These medicines can thin your blood. Do not take these medicines before your procedure if your doctor tells you not to. Tests  You may have blood tests.  You may have  a test to check the electrical signals in your heart (electrocardiogram, ECG).  You may have imaging tests, such as a chest X-ray. General instructions  For 24 hours before the procedure, stop using products that contain  nicotine or tobacco, such as cigarettes and e-cigarettes. If you need help quitting, ask your health care provider.  Plan to have someone take you home from the hospital or clinic.  You may be asked to shower with a germ-killing soap. What happens during the procedure?  To reduce your risk of infection: ? Your health care team will wash or sanitize their hands. ? Your skin will be washed with soap. ? Hair may be removed from the surgical area.  Small monitors will be put on your body. They will be used to check your heart, blood pressure, and oxygen level.  An IV tube will be inserted into one of your veins.  You will be given one or more of the following: ? A medicine to help you relax (sedative). ? A medicine to numb the area (local anesthetic). ? A medicine to make you fall asleep (general anesthetic).  Leads will be guided through a blood vessel into your heart and attached to your heart muscles. Depending on the ICD, the leads may go into one ventricle or they may go into both ventricles and into an upper chamber of the heart. An X-ray machine (fluoroscope) will be usedto help guide the leads.  A small incision will be made to create a deep pocket under your skin.  The pulse generator will be placed into the pocket.  The ICD will be tested.  The incision will be closed with stitches (sutures), skin glue, or staples.  A bandage (dressing) will be placed over the incision. This procedure may vary among health care providers and hospitals. What happens after the procedure?  Your blood pressure, heart rate, breathing rate, and blood oxygen level will be monitored often until the medicines you were given have worn off.  A chest X-ray will be taken to check that the ICD is in the right place.  You will need to stay in the hospital for 1-2 days so your health care provider can make sure your ICD is working.  Do not drive for 24 hours if you received a sedative. Ask your health  care provider when it is safe for you to drive.  You may be given an identification card explaining that you have an ICD. Summary  An implantable cardioverter defibrillator (ICD) is a small device that is placed under the skin in the chest or abdomen. It is used to detect and correct dangerous irregular heartbeats (arrhythmias).  An ICD consists of a battery, a small computer (pulse generator), and wires (leads) that go into the heart.  When an ICD detects rapid heart rhythm (tachycardia), it sends a low-energy shock to the heart to restore the heartbeat to normal (cardioversion). If cardioversion does not work or if the ICD detects uncoordinated heart contractions (fibrillation), it delivers a high-energy shock to the heart (defibrillation) to restart the heart.  You will need to stay in the hospital for 1-2 days to make sure your ICD is working. This information is not intended to replace advice given to you by your health care provider. Make sure you discuss any questions you have with your health care provider. Document Revised: 09/24/2017 Document Reviewed: 10/21/2016 Elsevier Patient Education  2020 [employer].

## 2020-08-09 ENCOUNTER — Telehealth: Payer: Self-pay

## 2020-08-09 NOTE — Telephone Encounter (Signed)
Call returned to Pt.  Advised per Dr. Gita Kudo Pt is ok to proceed with device implant because he has completed his MRI.  Pt is interested in November 2.  He is going to discuss with his family in regards to transportation and will call this nurse back.

## 2020-08-26 ENCOUNTER — Other Ambulatory Visit: Payer: PPO | Admitting: *Deleted

## 2020-08-26 ENCOUNTER — Other Ambulatory Visit (HOSPITAL_COMMUNITY)
Admission: RE | Admit: 2020-08-26 | Discharge: 2020-08-26 | Disposition: A | Discharge status: Home / Self Care | Payer: PPO | Source: Ambulatory Visit | Attending: Internal Medicine | Admitting: Internal Medicine

## 2020-08-26 ENCOUNTER — Telehealth: Payer: Self-pay | Admitting: Internal Medicine

## 2020-08-26 ENCOUNTER — Other Ambulatory Visit: Payer: Self-pay

## 2020-08-26 DIAGNOSIS — I428 Other cardiomyopathies: Secondary | ICD-10-CM

## 2020-08-26 DIAGNOSIS — I447 Left bundle-branch block, unspecified: Secondary | ICD-10-CM | POA: Diagnosis not present

## 2020-08-26 DIAGNOSIS — Z01812 Encounter for preprocedural laboratory examination: Secondary | ICD-10-CM | POA: Diagnosis not present

## 2020-08-26 DIAGNOSIS — Z20822 Contact with and (suspected) exposure to covid-19: Secondary | ICD-10-CM | POA: Diagnosis not present

## 2020-08-26 LAB — CBC WITH DIFFERENTIAL/PLATELET
Basophils Absolute: 0 10*3/uL (ref 0.0–0.2)
Basos: 0 %
EOS (ABSOLUTE): 0.2 10*3/uL (ref 0.0–0.4)
Eos: 3 %
Hematocrit: 39.6 % (ref 37.5–51.0)
Hemoglobin: 13.6 g/dL (ref 13.0–17.7)
Lymphocytes Absolute: 2.1 10*3/uL (ref 0.7–3.1)
Lymphs: 26 %
MCH: 33.5 pg — ABNORMAL HIGH (ref 26.6–33.0)
MCHC: 34.3 g/dL (ref 31.5–35.7)
MCV: 98 fL — ABNORMAL HIGH (ref 79–97)
Monocytes Absolute: 0.8 10*3/uL (ref 0.1–0.9)
Monocytes: 10 %
Neutrophils Absolute: 4.9 10*3/uL (ref 1.4–7.0)
Neutrophils: 61 %
Platelets: 227 10*3/uL (ref 150–450)
RBC: 4.06 x10E6/uL — ABNORMAL LOW (ref 4.14–5.80)
RDW: 13.5 % (ref 11.6–15.4)
WBC: 8 10*3/uL (ref 3.4–10.8)

## 2020-08-26 LAB — BASIC METABOLIC PANEL
BUN/Creatinine Ratio: 17 (ref 10–24)
BUN: 21 mg/dL (ref 8–27)
CO2: 27 mmol/L (ref 20–29)
Calcium: 9.1 mg/dL (ref 8.6–10.2)
Chloride: 109 mmol/L — ABNORMAL HIGH (ref 96–106)
Creatinine, Ser: 1.24 mg/dL (ref 0.76–1.27)
GFR calc Af Amer: 66 mL/min/{1.73_m2} (ref >59)
GFR calc non Af Amer: 57 mL/min/{1.73_m2} — ABNORMAL LOW (ref >59)
Glucose: 107 mg/dL — ABNORMAL HIGH (ref 65–99)
Potassium: 4.4 mmol/L (ref 3.5–5.2)
Sodium: 142 mmol/L (ref 134–144)

## 2020-08-26 LAB — SARS CORONAVIRUS 2 (TAT 6-24 HRS): SARS Coronavirus 2: NEGATIVE

## 2020-08-26 NOTE — Progress Notes (Signed)
Instructed patient on the following items: Arrival time 0700 Nothing to eat or drink after midnight Ok to take  AM of procedure- Entresto and Cardiovedilol Responsible person to drive you home and stay with you for 24 hrs Wash with special soap night before and morning of procedure

## 2020-08-26 NOTE — Telephone Encounter (Signed)
    Pt would like to verify his appt on 08/27/20 with Dr. Lovena Le he wants to make sure it's at 5:30 am for device implant

## 2020-08-26 NOTE — Telephone Encounter (Signed)
Work up complete.  Instruction letter and soap given to Pt.

## 2020-08-26 NOTE — Telephone Encounter (Signed)
Returned call to pt.  Advised he had not returned my call to confirm November 2.  Advised still possible if he could get labs and covid test now.  He will head to [facility] for labs.  Will give instructions and soap.  Then Pt will get covid test.  Advised his arrival time for procedure tomorrow is 7:00 am to [facility].

## 2020-08-27 ENCOUNTER — Ambulatory Visit (HOSPITAL_COMMUNITY): Payer: PPO

## 2020-08-27 ENCOUNTER — Ambulatory Visit (HOSPITAL_COMMUNITY)
Admission: RE | Admit: 2020-08-27 | Discharge: 2020-08-27 | Disposition: A | Discharge status: Home / Self Care | Payer: PPO | Attending: Internal Medicine | Admitting: Internal Medicine

## 2020-08-27 ENCOUNTER — Ambulatory Visit (HOSPITAL_COMMUNITY)
Admission: RE | Disposition: A | Discharge status: Home / Self Care | Payer: PPO | Source: Home / Self Care | Attending: Internal Medicine

## 2020-08-27 DIAGNOSIS — F17200 Nicotine dependence, unspecified, uncomplicated: Secondary | ICD-10-CM | POA: Insufficient documentation

## 2020-08-27 DIAGNOSIS — Z95810 Presence of automatic (implantable) cardiac defibrillator: Secondary | ICD-10-CM | POA: Diagnosis not present

## 2020-08-27 DIAGNOSIS — I447 Left bundle-branch block, unspecified: Secondary | ICD-10-CM | POA: Diagnosis not present

## 2020-08-27 DIAGNOSIS — Z006 Encounter for examination for normal comparison and control in clinical research program: Secondary | ICD-10-CM | POA: Diagnosis not present

## 2020-08-27 DIAGNOSIS — I4891 Unspecified atrial fibrillation: Secondary | ICD-10-CM | POA: Diagnosis not present

## 2020-08-27 DIAGNOSIS — Z7982 Long term (current) use of aspirin: Secondary | ICD-10-CM | POA: Insufficient documentation

## 2020-08-27 DIAGNOSIS — I428 Other cardiomyopathies: Secondary | ICD-10-CM | POA: Insufficient documentation

## 2020-08-27 DIAGNOSIS — I5022 Chronic systolic (congestive) heart failure: Secondary | ICD-10-CM | POA: Insufficient documentation

## 2020-08-27 DIAGNOSIS — Z79899 Other long term (current) drug therapy: Secondary | ICD-10-CM | POA: Diagnosis not present

## 2020-08-27 DIAGNOSIS — I4892 Unspecified atrial flutter: Secondary | ICD-10-CM | POA: Insufficient documentation

## 2020-08-27 HISTORY — PX: BIV ICD INSERTION CRT-D: EP1195

## 2020-08-27 SURGERY — BIV ICD INSERTION CRT-D

## 2020-08-27 MED ORDER — LIDOCAINE HCL (PF) 1 % IJ SOLN
INTRAMUSCULAR | Status: DC | PRN
  Administered 2020-08-27: 60 mL

## 2020-08-27 MED ORDER — LIDOCAINE HCL 1 % IJ SOLN
INTRAMUSCULAR | Status: AC
  Filled 2020-08-27: qty 60

## 2020-08-27 MED ORDER — HEPARIN (PORCINE) IN NACL 1000-0.9 UT/500ML-% IV SOLN
INTRAVENOUS | Status: AC
  Filled 2020-08-27: qty 500

## 2020-08-27 MED ORDER — FENTANYL CITRATE (PF) 100 MCG/2ML IJ SOLN
INTRAMUSCULAR | Status: AC
  Filled 2020-08-27: qty 2

## 2020-08-27 MED ORDER — IOHEXOL 350 MG/ML SOLN
INTRAVENOUS | Status: DC | PRN
  Administered 2020-08-27: 20 mL

## 2020-08-27 MED ORDER — HEPARIN (PORCINE) IN NACL 1000-0.9 UT/500ML-% IV SOLN
INTRAVENOUS | Status: DC | PRN
  Administered 2020-08-27: 500 mL

## 2020-08-27 MED ORDER — SODIUM CHLORIDE 0.9 % IV SOLN
80.0000 mg | INTRAVENOUS | Status: AC
  Administered 2020-08-27: 80 mg

## 2020-08-27 MED ORDER — MIDAZOLAM HCL 5 MG/5ML IJ SOLN
INTRAMUSCULAR | Status: DC | PRN
  Administered 2020-08-27: 1 mg via INTRAVENOUS
  Administered 2020-08-27: 2 mg via INTRAVENOUS
  Administered 2020-08-27: 1 mg via INTRAVENOUS

## 2020-08-27 MED ORDER — FENTANYL CITRATE (PF) 100 MCG/2ML IJ SOLN
INTRAMUSCULAR | Status: DC | PRN
  Administered 2020-08-27: 25 ug via INTRAVENOUS
  Administered 2020-08-27 (×2): 12.5 ug via INTRAVENOUS

## 2020-08-27 MED ORDER — CEFAZOLIN SODIUM-DEXTROSE 1-4 GM/50ML-% IV SOLN: 1.0000 g | Freq: Four times a day (QID) | INTRAVENOUS | Status: DC

## 2020-08-27 MED ORDER — CHLORHEXIDINE GLUCONATE 4 % EX LIQD: 4.0000 "application " | Freq: Once | CUTANEOUS | Status: DC

## 2020-08-27 MED ORDER — SODIUM CHLORIDE 0.9 % IV SOLN
INTRAVENOUS | Status: AC
  Filled 2020-08-27: qty 2

## 2020-08-27 MED ORDER — SODIUM CHLORIDE 0.9 % IV SOLN: INTRAVENOUS | Status: DC

## 2020-08-27 MED ORDER — CEFAZOLIN SODIUM-DEXTROSE 2-4 GM/100ML-% IV SOLN
INTRAVENOUS | Status: AC
  Filled 2020-08-27: qty 100

## 2020-08-27 MED ORDER — ACETAMINOPHEN 325 MG PO TABS: 325.0000 mg | ORAL_TABLET | ORAL | Status: DC | PRN

## 2020-08-27 MED ORDER — ONDANSETRON HCL 4 MG/2ML IJ SOLN: 4.0000 mg | Freq: Four times a day (QID) | INTRAMUSCULAR | Status: DC | PRN

## 2020-08-27 MED ORDER — MIDAZOLAM HCL 5 MG/5ML IJ SOLN
INTRAMUSCULAR | Status: AC
  Filled 2020-08-27: qty 5

## 2020-08-27 MED ORDER — CEFAZOLIN SODIUM-DEXTROSE 2-4 GM/100ML-% IV SOLN
2.0000 g | INTRAVENOUS | Status: AC
  Administered 2020-08-27: 2 g via INTRAVENOUS

## 2020-08-27 SURGICAL SUPPLY — 16 items
CABLE SURGICAL S-101-97-12 (CABLE) ×3 IMPLANT
CATH ACUITYPRO 45CM EH 9F (CATHETERS) ×3 IMPLANT
CATH ACUITYPRO 45CM H 9F (CATHETERS) ×3 IMPLANT
CATH JOSEPH QUAD ALLRED 6F REP (CATHETERS) ×3 IMPLANT
COVER DOME SNAP 22 D (MISCELLANEOUS) ×3 IMPLANT
ICD MOMENTUM X4 CRT-D G138 (ICD Generator) ×3 IMPLANT
LEAD ACUITY X4 4671 (Lead) ×3 IMPLANT
LEAD INGEVITY 7840 45 (Lead) ×3 IMPLANT
LEAD RELIANCE 0137-59 (Lead) ×3 IMPLANT
PAD PRO RADIOLUCENT 2001M-C (PAD) ×3 IMPLANT
SHEATH 7FR PRELUDE SNAP 13 (SHEATH) ×3 IMPLANT
SHEATH 9.5FR PRELUDE SNAP 13 (SHEATH) ×3 IMPLANT
SHEATH 9FR PRELUDE SNAP 25 (SHEATH) ×3 IMPLANT
TRAY PACEMAKER INSERTION (PACKS) ×3 IMPLANT
WIRE ACUITY WHISPER EDS 4648 (WIRE) ×3 IMPLANT
WIRE HI TORQ VERSACORE-J 145CM (WIRE) ×9 IMPLANT

## 2020-08-27 NOTE — Discharge Instructions (Signed)
After Your ICD (Implantable Cardiac Defibrillator)   . 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 Do not lift your arm above shoulder height for 1 week after your procedure. After 7 days, you may progress as below.     Tuesday September 03, 2020  Wednesday September 04, 2020 Thursday September 05, 2020 Friday September 06, 2020   . Do not lift, push, pull, or carry anything over 10 pounds with the affected arm until 6 weeks (Tuesday October 08, 2020 ) after your procedure.   . Do NOT DRIVE until you have been seen for your wound check, or as long as instructed by your healthcare provider.   . Ask your healthcare provider when you can go back to work   INCISION/Dressing  . Monitor your defibrillator site for redness, swelling, and drainage. Call the device clinic at [phone] if you experience these symptoms or fever/chills.  . If your incision is sealed with Steri-strips or staples, you may shower 10 days after your procedure or when told by your provider. Do not remove the steri-strips or let the shower hit directly on your site. You may wash around your site with soap and water.    Marland Kitchen Avoid lotions, ointments, or perfumes over your incision until it is well-healed.  . You may use a hot tub or a pool AFTER your wound check appointment if the incision is completely closed.  . Your ICD is designed to protect you from life threatening heart rhythms. Because of this, you may receive a shock.   o 1 shock with no symptoms:  Call the office during business hours. o 1 shock with symptoms (chest pain, chest pressure, dizziness, lightheadedness, shortness of breath, overall feeling unwell):  Call 911. o If you experience 2 or more shocks in 24 hours:  Call 911. o If you receive a shock, you should not drive for 6 months per the [place] DMV IF you receive appropriate therapy from your ICD.   . ICD Alerts:  Some alerts are vibratory and others beep. These are NOT emergencies. Please call our  office to let us know. If this occurs at night or on weekends, it can wait until the next business day. Send a remote transmission.  . If your device is capable of reading fluid status (for heart failure), you will be offered monthly monitoring to review this with you.   DEVICE MANAGEMENT . Remote monitoring is used to monitor your ICD from home. This monitoring is scheduled every 91 days by our office. It allows [place] to keep an eye on the functioning of your device to ensure it is working properly. You will routinely see your Electrophysiologist annually (more often if necessary).   . You should receive your ID card for your new device in 4-8 weeks. Keep this card with you at all times once received. Consider wearing a medical alert bracelet or necklace.  . Your ICD  may be MRI compatible. This will be discussed at your next office visit/wound check.  You should avoid contact with strong electric or magnetic fields.    Do not use amateur (ham) radio equipment or electric (arc) welding torches. MP3 player headphones with magnets should not be used. Some devices are safe to use if held at least 12 inches (30 cm) from your defibrillator. These include power tools, lawn mowers, and speakers. If you are unsure if something is safe to use, ask your health care provider.   When using your cell phone, hold  it to the ear that is on the opposite side from the defibrillator. Do not leave your cell phone in a pocket over the defibrillator.   You may safely use electric blankets, heating pads, computers, and microwave ovens.  Call the office right away if:  You have chest pain.  You feel more than one shock.  You feel more short of breath than you have felt before.  You feel more light-headed than you have felt before.  Your incision starts to open up.  This information is not intended to replace advice given to you by your health care provider. Make sure you discuss any questions you have with  your health care provider.

## 2020-08-27 NOTE — Interval H&P Note (Signed)
History and Physical Interval Note:  08/27/2020 11:01 AM  Bryce Cruz  has presented today for surgery, with the diagnosis of cardiomyopathy.  The various methods of treatment have been discussed with the patient and family. After consideration of risks, benefits and other options for treatment, the patient has consented to  Procedure(s): BIV ICD INSERTION CRT-D (N/A) as a surgical intervention.  The patient's history has been reviewed, patient examined, no change in status, stable for surgery.  I have reviewed the patient's chart and labs.  Questions were answered to the patient's satisfaction.     [name]

## 2020-08-28 ENCOUNTER — Encounter (HOSPITAL_COMMUNITY): Payer: Self-pay | Admitting: Internal Medicine

## 2020-08-28 ENCOUNTER — Telehealth: Payer: Self-pay | Admitting: Internal Medicine

## 2020-08-28 MED FILL — Lidocaine HCl Local Inj 1%: INTRAMUSCULAR | Qty: 60 | Status: AC

## 2020-08-28 NOTE — Telephone Encounter (Signed)
Patient reports that when he sits up or stands up he feels a " jerking"  sensation in his left in his left side. He has no other s/sx . He had BIV [place] ICD placed 08/27/20. Patient scheduled 08/28/20 at 0800 to evaluate PNS.

## 2020-08-28 NOTE — Telephone Encounter (Signed)
Patient wants to speak with a nurse. Please call

## 2020-08-29 ENCOUNTER — Other Ambulatory Visit: Payer: Self-pay

## 2020-08-29 ENCOUNTER — Ambulatory Visit (INDEPENDENT_AMBULATORY_CARE_PROVIDER_SITE_OTHER): Payer: PPO | Admitting: Emergency Medicine

## 2020-08-29 DIAGNOSIS — I428 Other cardiomyopathies: Secondary | ICD-10-CM

## 2020-08-29 NOTE — Progress Notes (Signed)
Pt in clinic for CRT-D testing/ reprogramming due to PNS following implant.  Testing/ reprogramming completed with industry.  PNS observed upon pt arrival.  LV lead tested in different vectors  LV3 to RV tested 1.7 @1 .ms with no PNS.  LV4 to RV and LV3 to can were also tested with higher outputs.  No PNS observed LV3 to RV, patient walked around and laid down with no reproducible stimulation.  Device programmed LV3 to RV with output 2.5v@ 37ms.  Patient provided with education to call if stimulation reoccurs.  Pt scheduled for wound check on 09/10/20.

## 2020-09-02 NOTE — H&P (Signed)
  ICD Criteria  Current LVEF:30%. Within 12 months prior to implant: Yes   Heart failure history: Yes, Class II  Cardiomyopathy history: Yes, Non-Ischemic Cardiomyopathy.  Atrial Fibrillation/Atrial Flutter: Yes, Paroxysmal.  Ventricular tachycardia history: No.  Cardiac arrest history: No.  History of syndromes with risk of sudden death: No.  Previous ICD: No.  Current ICD indication: Primary  PPM indication: No.  Class I or II Bradycardia indication present: No  Beta Blocker therapy for 3 or more months: Yes, prescribed.   Ace Inhibitor/ARB therapy for 3 or more months: Yes, prescribed.    I have seen Bryce Cruz is a 74 y.o. malepre-procedural and has been referred by Dr. Reine Just for consideration of ICD implant for primary prevention of sudden death.  The patient's chart has been reviewed and they meet criteria for ICD implant.  I have had a thorough discussion with the patient reviewing options.  The patient and their family (if available) have had opportunities to ask questions and have them answered. The patient and I have decided together through the [employer] to proceed with ICD at this time.  Risks, benefits, alternatives to ICD implantation were discussed in detail with the patient today. The patient  understands that the risks include but are not limited to bleeding, infection, pneumothorax, perforation, tamponade, vascular damage, renal failure, MI, stroke, death, inappropriate shocks, and lead dislodgement and he wishes to proceed.

## 2020-09-10 ENCOUNTER — Ambulatory Visit (INDEPENDENT_AMBULATORY_CARE_PROVIDER_SITE_OTHER): Payer: PPO | Admitting: Emergency Medicine

## 2020-09-10 ENCOUNTER — Other Ambulatory Visit: Payer: Self-pay

## 2020-09-10 DIAGNOSIS — I447 Left bundle-branch block, unspecified: Secondary | ICD-10-CM | POA: Diagnosis not present

## 2020-09-10 DIAGNOSIS — I428 Other cardiomyopathies: Secondary | ICD-10-CM | POA: Diagnosis not present

## 2020-09-10 LAB — CUP PACEART INCLINIC DEVICE CHECK
Brady Statistic RA Percent Paced: 4 %
Brady Statistic RV Percent Paced: 95 %
Date Time Interrogation Session: 20211116111522
HighPow Impedance: 62 Ohm
Implantable Lead Implant Date: 20211102
Implantable Lead Implant Date: 20211102
Implantable Lead Implant Date: 20211102
Implantable Lead Location: 753858
Implantable Lead Location: 753859
Implantable Lead Location: 753860
Implantable Lead Model: 137
Implantable Lead Model: 4671
Implantable Lead Model: 7840
Implantable Pulse Generator Implant Date: 20211102
Lead Channel Impedance Value: 415 Ohm
Lead Channel Impedance Value: 475 Ohm
Lead Channel Impedance Value: 489 Ohm
Lead Channel Pacing Threshold Amplitude: 0.9 V
Lead Channel Pacing Threshold Amplitude: 1.2 V
Lead Channel Pacing Threshold Amplitude: 2.5 V
Lead Channel Pacing Threshold Pulse Width: 0.4 ms
Lead Channel Pacing Threshold Pulse Width: 0.4 ms
Lead Channel Pacing Threshold Pulse Width: 1 ms
Lead Channel Sensing Intrinsic Amplitude: 18.7 mV
Lead Channel Sensing Intrinsic Amplitude: 4.7 mV
Lead Channel Sensing Intrinsic Amplitude: 4.8 mV
Lead Channel Setting Pacing Amplitude: 3 V
Lead Channel Setting Pacing Amplitude: 3.1 V
Lead Channel Setting Pacing Amplitude: 3.5 V
Lead Channel Setting Pacing Pulse Width: 0.4 ms
Lead Channel Setting Pacing Pulse Width: 1 ms
Lead Channel Setting Sensing Sensitivity: 0.5 mV
Lead Channel Setting Sensing Sensitivity: 1 mV
Pulse Gen Serial Number: 387447

## 2020-09-10 NOTE — Progress Notes (Signed)
Wound check appointment. Steri-strips removed. Wound without redness or edema. Incision edges approximated, wound well healed. Device interrogation completed with [employer] via [place].  Normal device function. Thresholds, sensing, and impedances consistent with implant measurements. Device programmed at 3.5V for extra safety margin until 3 month visit- Outputs adjusted today, Daily trend turned on for RA/RV lead, LV lead output increased to 3.1V.  Histogram distribution appropriate for patient and level of activity. No mode switches or ventricular arrhythmias noted. Patient is biVentricularly pacing 95% of the time.   Patient educated about wound care, arm mobility, lifting restrictions, shock plan. Patient is enrolled in remote monitoring, next scheduled check 11/25/20.  ROV with Dr. Lovena Le on 12/02/20.

## 2020-09-16 DIAGNOSIS — R339 Retention of urine, unspecified: Secondary | ICD-10-CM | POA: Diagnosis not present

## 2020-09-16 DIAGNOSIS — N401 Enlarged prostate with lower urinary tract symptoms: Secondary | ICD-10-CM | POA: Diagnosis not present

## 2020-09-21 NOTE — Progress Notes (Signed)
Advanced Heart Failure Clinic Note    Date:  09/25/2020   ID:  Bryce Cruz, DOB [birth year], MRN [id number]  Location: Home  Provider location: [facility] Type of Visit: New consult from Dr. Fredricka Cruz  PCP:  Bryce Sanes, MD  Cardiologist:  No primary care provider on file. Primary HF: New- Bryce Cruz  Chief Complaint: Heart Failure follow-up   History of Present Illness:  [name] is a 74 y/o male with h/o colon cancer (resected 12/20. No chemo).  Admitted to [facility]/18/19  with acute HF and orthopnea. EF found to be 20% by echo. Also found to have LBBB.   He was found to have frequent PVCs and started on amio to suppress given concern over PVC cardiomyopathy. EF did not improve with PVC suppression and felt to be more likely LBBB CM.  cMRI 9/21: LVEF 29% with dyssynchrony. No LGE. RVEF 59%  Underwent BosSci CRT-D implant with Dr. Lovena Cruz on 08/27/20  Here for f/u. Feeling well, no SOB, CP, edema, palpitations, PND/orthopnea. Able to work in the yard and feels his energy has improved. Weights stable. Has not needed to take prn lasix. Recent ICD placement with Dr. Lovena Cruz.    Cath 2/21:  1. Mild non-obstructive CAD 2. 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 Well-compensated hemodynamics with low filling pressures  Zio 2/21: 1. NSR - min HR of 39 bpm, avg HR of 67 bpm. 2. Two runs of NSVT  - the longest lasting 8 beats with an avg rate of 105 bpm.  3. 18 runs of SVT - the longest lasting 14.2 secs with an avg rate of 99 bpm. 4. Frequent PACs (5.3%, 34831)  5, Frequent PVCs (6.2%, 48185) with two priamry morphologies (5.0% and 1.2% respectively)  6. Difficulty discerning atrial activity making definitive diagnosis difficult to ascertain.   PMHx 1. LBBB (unknown duration) 2. Colon cancer s/p resection  Current Meds  Medication Sig  . alfuzosin (UROXATRAL) 10 MG 24 hr tablet Take 10 mg by mouth daily as needed  (Fluid retention).   Marland Kitchen amiodarone (PACERONE) 200 MG tablet Take 1 tablet (200 mg total) by mouth daily.  Marland Kitchen aspirin EC 81 MG tablet Take 81 mg by mouth daily.  . carvedilol (COREG) 3.125 MG tablet Take 1 tablet (3.125 mg total) by mouth 2 (two) times daily.  . fluticasone (FLONASE) 50 MCG/ACT nasal spray Place 1 spray into both nostrils daily as needed for allergies or rhinitis.  . furosemide (LASIX) 40 MG tablet Take 1 tablet (40 mg total) by mouth daily as needed.  . Multiple Vitamin (MULTIVITAMIN WITH MINERALS) TABS tablet Take 1 tablet by mouth daily.  Marland Kitchen zolpidem (AMBIEN) 5 MG tablet Take 2.5 mg by mouth at bedtime as needed for sleep.   . [DISCONTINUED] sacubitril-valsartan (ENTRESTO) 49-51 MG Take 1 tablet by mouth 2 (two) times daily.    Allergies:  NKDA  Social History:  Occasional tobacco. No ETOH   Family History:  Mom - CAD s/p CABG deceased Dad - died from SCD (heavy smoker and drinker) Sister with PPM Younger sister is disabled  Vitals:   09/25/20 1118  BP: (!) 148/82  Pulse: 67  SpO2: 98%    Exam:   General:  Well appearing. No resp difficulty HEENT: normal Neck: supple. no JVD. Carotids 2+ bilat; no bruits. No lymphadenopathy or thryomegaly appreciated. Cor: PMI nondisplaced. Regular rate & rhythm. No rubs, gallops or murmurs. Slight swelling over ICD site Lungs:  clear Abdomen: soft, nontender, nondistended. No hepatosplenomegaly. No bruits or masses. Good bowel sounds. Extremities: no cyanosis, clubbing, rash, edema Neuro: alert & orientedx3, cranial nerves grossly intact. moves all 4 extremities w/o difficulty. Affect pleasant   Recent Labs: 06/25/2020: ALT 29; B Natriuretic Peptide 480.5; TSH 11.573 08/26/2020: BUN 21; Creatinine, Ser 1.24; Hemoglobin 13.6; Platelets 227; Potassium 4.4; Sodium 142  Personally reviewed   Wt Readings from Last 3 Encounters:  09/25/20 74.1 kg (163 lb 6.4 oz)  08/27/20 76.2 kg (168 lb)  08/06/20 74.6 kg (164 lb 6.4 oz)     ECG: NSR 69 LBBB (142ms) frequent PVCs. Personally reviewed  ECG today 12/21: A sensed V paced, 64 bpm, QRS 144 ms (Personally reviewed)  Device interrogation 12/21: LV paced 95%, no AT/AF, activity level ~1.5-2 hours daily, impedence at threshold  ASSESSMENT AND PLAN:  1.  Chronic systolic HF due to NICM (suspect LBBB) - new diagnosis 4/20 - Cath 4/20. Minimal CAD. EF 20-25% - Etiology unclear. Possibly LBBB-CM (was told his ECG was a bit abnormal prior to colon cancer surgery) vs frequent PVCs - PVCs suppressed with amio but EF still 25% with marked dyssynchrony (06/25/20) -> suspect LBBB CM - Echo 8/21 EF 25% - cMRI 9/21: LVEF 29% with dyssynchrony. No LGE. RVEF 59% - s/p BosSci CRT-D implant with Dr. Lovena Cruz on 08/27/20 - Volume status looks good. NYHA I-II - Restart spiro 12.5 mg daily (unclear when he stopped taking med) - Increase Entresto 97/103 bid - Continue carvedilol 3.125 mg bid - Continue lasix PRN only  - Can consider SGLT2i next visit - CMET, TSH today; BMET in 7-10 days after re-starting Arlyce Harman - Repeat Echo in 3-4 months to assess for improvement with CRT (+/- PVC suppression)  2. Colon CA - s/p resection  - stable in remission - refused chemo  3. Frequent PVCs - quiescent - Continue amio 200 mg daily. PVCs suppressed on exam  - CMET & TSH today   Signed, Bryce Bickers, MD  09/25/2020 12:04 PM  Advanced Heart Failure [place] [address] Heart and [place] [address] [phone] (office) [phone] (fax)

## 2020-09-25 ENCOUNTER — Encounter (HOSPITAL_COMMUNITY): Payer: Self-pay | Admitting: Internal Medicine

## 2020-09-25 ENCOUNTER — Ambulatory Visit (HOSPITAL_COMMUNITY)
Admission: RE | Admit: 2020-09-25 | Discharge: 2020-09-25 | Disposition: A | Discharge status: Home / Self Care | Payer: PPO | Source: Ambulatory Visit | Attending: Internal Medicine | Admitting: Internal Medicine

## 2020-09-25 ENCOUNTER — Other Ambulatory Visit: Payer: Self-pay

## 2020-09-25 VITALS — BP 148/82 | HR 67 | Wt 163.4 lb

## 2020-09-25 DIAGNOSIS — Z79899 Other long term (current) drug therapy: Secondary | ICD-10-CM | POA: Diagnosis not present

## 2020-09-25 DIAGNOSIS — Z95810 Presence of automatic (implantable) cardiac defibrillator: Secondary | ICD-10-CM | POA: Diagnosis not present

## 2020-09-25 DIAGNOSIS — I447 Left bundle-branch block, unspecified: Secondary | ICD-10-CM | POA: Insufficient documentation

## 2020-09-25 DIAGNOSIS — I428 Other cardiomyopathies: Secondary | ICD-10-CM | POA: Insufficient documentation

## 2020-09-25 DIAGNOSIS — Z8249 Family history of ischemic heart disease and other diseases of the circulatory system: Secondary | ICD-10-CM | POA: Insufficient documentation

## 2020-09-25 DIAGNOSIS — I5022 Chronic systolic (congestive) heart failure: Secondary | ICD-10-CM | POA: Diagnosis not present

## 2020-09-25 DIAGNOSIS — I493 Ventricular premature depolarization: Secondary | ICD-10-CM | POA: Diagnosis not present

## 2020-09-25 DIAGNOSIS — Z7982 Long term (current) use of aspirin: Secondary | ICD-10-CM | POA: Insufficient documentation

## 2020-09-25 DIAGNOSIS — I2510 Atherosclerotic heart disease of native coronary artery without angina pectoris: Secondary | ICD-10-CM | POA: Diagnosis not present

## 2020-09-25 DIAGNOSIS — Z85038 Personal history of other malignant neoplasm of large intestine: Secondary | ICD-10-CM | POA: Diagnosis not present

## 2020-09-25 HISTORY — DX: Heart failure, unspecified: I50.9

## 2020-09-25 LAB — COMPREHENSIVE METABOLIC PANEL
ALT: 28 U/L (ref 0–44)
AST: 31 U/L (ref 15–41)
Albumin: 3.9 g/dL (ref 3.5–5.0)
Alkaline Phosphatase: 70 U/L (ref 38–126)
Anion gap: 8 (ref 5–15)
BUN: 23 mg/dL (ref 8–23)
CO2: 22 mmol/L (ref 22–32)
Calcium: 9.3 mg/dL (ref 8.9–10.3)
Chloride: 109 mmol/L (ref 98–111)
Creatinine, Ser: 1.41 mg/dL — ABNORMAL HIGH (ref 0.61–1.24)
GFR, Estimated: 52 mL/min — ABNORMAL LOW (ref >60)
Glucose, Bld: 108 mg/dL — ABNORMAL HIGH (ref 70–99)
Potassium: 4.6 mmol/L (ref 3.5–5.1)
Sodium: 139 mmol/L (ref 135–145)
Total Bilirubin: 0.7 mg/dL (ref 0.3–1.2)
Total Protein: 6.2 g/dL — ABNORMAL LOW (ref 6.5–8.1)

## 2020-09-25 LAB — TSH: TSH: 12.935 u[IU]/mL — ABNORMAL HIGH (ref 0.350–4.500)

## 2020-09-25 MED ORDER — SPIRONOLACTONE 25 MG PO TABS: 12.5000 mg | ORAL_TABLET | Freq: Every day | ORAL | 3 refills | Status: DC

## 2020-09-25 MED ORDER — SACUBITRIL-VALSARTAN 97-103 MG PO TABS: 1.0000 | ORAL_TABLET | Freq: Two times a day (BID) | ORAL | 3 refills | Status: DC

## 2020-09-25 NOTE — Patient Instructions (Signed)
RESTART Spironolactone 12.5mg  (1/2 tablet) Daily  INCREASE Entresto 97/103mg  (1 tablet) twice dialy  Labs done today, your results will be available in MyChart, we will contact you for abnormal readings.  Your physician recommends that you schedule repeat labs in 7-10 days  Your physician recommends that you schedule a follow-up appointment in: 3-4 months with an echocardiogram  Your physician has requested that you have an echocardiogram. Echocardiography is a painless test that uses sound waves to create images of your heart. It provides your doctor with information about the size and shape of your heart and how well your heart's chambers and valves are working. This procedure takes approximately one hour. There are no restrictions for this procedure.   If you have any questions or concerns before your next appointment please send [place] a message through [place] or call our office at [phone].    TO LEAVE A MESSAGE FOR THE NURSE SELECT OPTION 2, PLEASE LEAVE A MESSAGE INCLUDING: . YOUR NAME . DATE OF BIRTH . CALL BACK NUMBER . REASON FOR CALL**this is important as we prioritize the call backs  YOU WILL RECEIVE A CALL BACK THE SAME DAY AS LONG AS YOU CALL BEFORE 4:00 PM

## 2020-09-26 DIAGNOSIS — Z961 Presence of intraocular lens: Secondary | ICD-10-CM | POA: Diagnosis not present

## 2020-09-26 DIAGNOSIS — H18003 Unspecified corneal deposit, bilateral: Secondary | ICD-10-CM | POA: Diagnosis not present

## 2020-10-02 DIAGNOSIS — C187 Malignant neoplasm of sigmoid colon: Secondary | ICD-10-CM | POA: Diagnosis not present

## 2020-10-07 ENCOUNTER — Other Ambulatory Visit (HOSPITAL_COMMUNITY): Payer: PPO

## 2020-10-15 ENCOUNTER — Other Ambulatory Visit: Payer: Self-pay

## 2020-10-15 ENCOUNTER — Ambulatory Visit (HOSPITAL_COMMUNITY)
Admission: RE | Admit: 2020-10-15 | Discharge: 2020-10-15 | Disposition: A | Discharge status: Home / Self Care | Payer: PPO | Source: Ambulatory Visit | Attending: Internal Medicine | Admitting: Internal Medicine

## 2020-10-15 DIAGNOSIS — I428 Other cardiomyopathies: Secondary | ICD-10-CM | POA: Diagnosis not present

## 2020-10-15 LAB — BASIC METABOLIC PANEL
Anion gap: 8 (ref 5–15)
BUN: 24 mg/dL — ABNORMAL HIGH (ref 8–23)
CO2: 23 mmol/L (ref 22–32)
Calcium: 9.3 mg/dL (ref 8.9–10.3)
Chloride: 110 mmol/L (ref 98–111)
Creatinine, Ser: 1.54 mg/dL — ABNORMAL HIGH (ref 0.61–1.24)
GFR, Estimated: 47 mL/min — ABNORMAL LOW (ref >60)
Glucose, Bld: 83 mg/dL (ref 70–99)
Potassium: 4.6 mmol/L (ref 3.5–5.1)
Sodium: 141 mmol/L (ref 135–145)

## 2020-10-17 ENCOUNTER — Telehealth (HOSPITAL_COMMUNITY): Payer: Self-pay

## 2020-10-17 MED ORDER — SYNTHROID 50 MCG PO TABS: 50.0000 ug | ORAL_TABLET | Freq: Every day | ORAL | 11 refills | Status: DC

## 2020-10-17 NOTE — Telephone Encounter (Signed)
Patient advised and verbalized understanding. Doesn't want to schedule a another lab appointment at this time. New Rx sent into patients pharmacy. Patient reports he isn't really concerned about his abnormal tsh results because his pcp said its always high.

## 2020-10-17 NOTE — Telephone Encounter (Signed)
-----   Message from Jolaine Artist, MD sent at 10/10/2020  2:08 PM EST ----- Start synthroid 50 daily. Repeat TFTs in 1 months.

## 2020-11-14 DIAGNOSIS — J069 Acute upper respiratory infection, unspecified: Secondary | ICD-10-CM | POA: Diagnosis not present

## 2020-11-14 DIAGNOSIS — U071 COVID-19: Secondary | ICD-10-CM | POA: Diagnosis not present

## 2020-11-15 ENCOUNTER — Other Ambulatory Visit (HOSPITAL_COMMUNITY): Payer: Self-pay | Admitting: Internal Medicine

## 2020-11-25 ENCOUNTER — Ambulatory Visit (INDEPENDENT_AMBULATORY_CARE_PROVIDER_SITE_OTHER): Payer: PPO

## 2020-11-25 DIAGNOSIS — I428 Other cardiomyopathies: Secondary | ICD-10-CM

## 2020-11-25 LAB — CUP PACEART REMOTE DEVICE CHECK
Battery Remaining Longevity: 96 mo
Battery Remaining Percentage: 100 %
Brady Statistic RA Percent Paced: 15 %
Brady Statistic RV Percent Paced: 95 %
Date Time Interrogation Session: 20220131041100
HighPow Impedance: 55 Ohm
Implantable Lead Implant Date: 20211102
Implantable Lead Implant Date: 20211102
Implantable Lead Implant Date: 20211102
Implantable Lead Location: 753858
Implantable Lead Location: 753859
Implantable Lead Location: 753860
Implantable Lead Model: 137
Implantable Lead Model: 4671
Implantable Lead Model: 7840
Implantable Pulse Generator Implant Date: 20211102
Lead Channel Impedance Value: 385 Ohm
Lead Channel Impedance Value: 447 Ohm
Lead Channel Impedance Value: 485 Ohm
Lead Channel Pacing Threshold Amplitude: 0.6 V
Lead Channel Pacing Threshold Amplitude: 0.8 V
Lead Channel Pacing Threshold Pulse Width: 0.4 ms
Lead Channel Pacing Threshold Pulse Width: 0.4 ms
Lead Channel Setting Pacing Amplitude: 3 V
Lead Channel Setting Pacing Amplitude: 3.1 V
Lead Channel Setting Pacing Amplitude: 3.5 V
Lead Channel Setting Pacing Pulse Width: 0.4 ms
Lead Channel Setting Pacing Pulse Width: 1 ms
Lead Channel Setting Sensing Sensitivity: 0.5 mV
Lead Channel Setting Sensing Sensitivity: 1 mV
Pulse Gen Serial Number: 387447

## 2020-12-02 ENCOUNTER — Encounter: Payer: Self-pay | Admitting: Internal Medicine

## 2020-12-02 ENCOUNTER — Other Ambulatory Visit: Payer: Self-pay

## 2020-12-02 ENCOUNTER — Ambulatory Visit: Payer: PPO | Admitting: Internal Medicine

## 2020-12-02 DIAGNOSIS — I428 Other cardiomyopathies: Secondary | ICD-10-CM | POA: Diagnosis not present

## 2020-12-02 NOTE — Progress Notes (Signed)
HPI Bryce Cruz returns for followup, s/p BIV ICD insertion. He is a pleasant 75 yo man with chronic systolic heart failure initially diagnosed on 4/20. He has been treated with maximal medical therapy. He has less than 10% PVC's on a cardiac monitor. He has not had syncope. He has class 2 symptoms. He still works part time. He has been on amiodarone. He is s/p biv ICD insertion and appears to be doing well.  No Known Allergies   Current Outpatient Medications  Medication Sig Dispense Refill  . alfuzosin (UROXATRAL) 10 MG 24 hr tablet Take 10 mg by mouth daily as needed (Fluid retention).     Marland Kitchen amiodarone (PACERONE) 200 MG tablet TAKE 1 TABLET BY MOUTH ONCE DAILY. 30 tablet 6  . aspirin EC 81 MG tablet Take 81 mg by mouth daily.    . carvedilol (COREG) 3.125 MG tablet Take 1 tablet (3.125 mg total) by mouth 2 (two) times daily. 60 tablet 11  . fluticasone (FLONASE) 50 MCG/ACT nasal spray Place 1 spray into both nostrils daily as needed for allergies or rhinitis.    . furosemide (LASIX) 40 MG tablet Take 1 tablet (40 mg total) by mouth daily as needed. 30 tablet   . Multiple Vitamin (MULTIVITAMIN WITH MINERALS) TABS tablet Take 1 tablet by mouth daily.    . sacubitril-valsartan (ENTRESTO) 97-103 MG Take 1 tablet by mouth 2 (two) times daily. 60 tablet 3  . spironolactone (ALDACTONE) 25 MG tablet Take 0.5 tablets (12.5 mg total) by mouth daily. 15 tablet 3  . SYNTHROID 50 MCG tablet Take 1 tablet (50 mcg total) by mouth daily before breakfast. 30 tablet 11  . zolpidem (AMBIEN) 5 MG tablet Take 2.5 mg by mouth at bedtime as needed for sleep.      No current facility-administered medications for this visit.     Past Medical History:  Diagnosis Date  . CHF (congestive heart failure) (HCC)     ROS:   All systems reviewed and negative except as noted in the HPI.   Past Surgical History:  Procedure Laterality Date  . BIV ICD INSERTION CRT-D N/A 08/27/2020   Procedure: BIV ICD  INSERTION CRT-D;  Surgeon: Evans Lance, MD;  Location: [place] [facility];  Service: Cardiovascular;  Laterality: N/A;  . RIGHT/LEFT HEART CATH AND CORONARY ANGIOGRAPHY N/A 12/19/2019   Procedure: RIGHT/LEFT HEART CATH AND CORONARY ANGIOGRAPHY;  Surgeon: Jolaine Artist, MD;  Location: [place] [facility];  Service: Cardiovascular;  Laterality: N/A;     History reviewed. No pertinent family history.   Social History   Socioeconomic History  . Marital status: Single    Spouse name: Not on file  . Number of children: Not on file  . Years of education: Not on file  . Highest education level: Not on file  Occupational History  . Not on file  Tobacco Use  . Smoking status: Light Tobacco Smoker  . Smokeless tobacco: Never Used  . Tobacco comment: social smoker  Substance and Sexual Activity  . Alcohol use: Never  . Drug use: Not on file  . Sexual activity: Not on file  Other Topics Concern  . Not on file  Social History Narrative  . Not on file   Social Determinants of Health   Financial Resource Strain: Not on file  Food Insecurity: Not on file  Transportation Needs: Not on file  Physical Activity: Not on file  Stress: Not on file  Social Connections:  Not on file  Intimate Partner Violence: Not on file     BP (!) 164/86   Pulse 61   Ht 5\' 7"  (1.702 m)   Wt 168 lb (76.2 kg)   SpO2 96%   BMI 26.31 kg/m   Physical Exam:  Well appearing NAD HEENT: Unremarkable Neck:  No JVD, no thyromegally Lymphatics:  No adenopathy Back:  No CVA tenderness Lungs:  Clear with no wheezes HEART:  Regular rate rhythm, no murmurs, no rubs, no clicks Abd:  soft, positive bowel sounds, no organomegally, no rebound, no guarding Ext:  2 plus pulses, no edema, no cyanosis, no clubbing Skin:  No rashes no nodules Neuro:  CN II through XII intact, motor grossly intact  EKG - nsr with ventricular pacing  DEVICE  Normal device function.  See PaceArt for details.    Assess/Plan: 1. ICD - His [employer] biv ICD is working normally. We will recheck in several months. We made his LV/RV delay 20 ms early today 2. Chronic systolic heart failure - his symptoms are class 2. No change in his meds.] 3. HTN - his sbp is elevated. He admits to some non-compliance. He is encouraged to reduce his sodium intake. 4. PVC's - he is doing well on 200 mg of amiodarone daily. He is paced over 95% of the time.  Bryce Overlie Allycia Pitz,MD

## 2020-12-02 NOTE — Patient Instructions (Signed)

## 2020-12-04 NOTE — Progress Notes (Signed)
Remote ICD transmission.   

## 2020-12-06 LAB — CUP PACEART INCLINIC DEVICE CHECK
Brady Statistic RA Percent Paced: 14 %
Brady Statistic RV Percent Paced: 95 %
Date Time Interrogation Session: 20220207000000
HighPow Impedance: 67 Ohm
Implantable Lead Implant Date: 20211102
Implantable Lead Implant Date: 20211102
Implantable Lead Implant Date: 20211102
Implantable Lead Location: 753858
Implantable Lead Location: 753859
Implantable Lead Location: 753860
Implantable Lead Model: 137
Implantable Lead Model: 4671
Implantable Lead Model: 7840
Implantable Pulse Generator Implant Date: 20211102
Lead Channel Impedance Value: 481 Ohm
Lead Channel Impedance Value: 487 Ohm
Lead Channel Impedance Value: 504 Ohm
Lead Channel Pacing Threshold Amplitude: 0.8 V
Lead Channel Pacing Threshold Amplitude: 0.9 V
Lead Channel Pacing Threshold Amplitude: 0.9 V
Lead Channel Pacing Threshold Pulse Width: 0.4 ms
Lead Channel Pacing Threshold Pulse Width: 0.4 ms
Lead Channel Pacing Threshold Pulse Width: 1 ms
Lead Channel Sensing Intrinsic Amplitude: 18.5 mV
Lead Channel Sensing Intrinsic Amplitude: 4.1 mV
Lead Channel Sensing Intrinsic Amplitude: 6.5 mV
Lead Channel Setting Pacing Amplitude: 2 V
Lead Channel Setting Pacing Amplitude: 2 V
Lead Channel Setting Pacing Amplitude: 2.5 V
Lead Channel Setting Pacing Pulse Width: 0.4 ms
Lead Channel Setting Pacing Pulse Width: 1 ms
Lead Channel Setting Sensing Sensitivity: 0.5 mV
Lead Channel Setting Sensing Sensitivity: 1 mV
Pulse Gen Serial Number: 387447

## 2020-12-29 NOTE — Progress Notes (Signed)
Advanced Heart Failure Clinic Note    Date:  12/29/2020   ID:  Bryce Cruz, DOB 05/12/1946, MRN [id number]  Location: Home  Provider location: [place] [facility] Type of Visit: New consult from Dr. Fredricka Bonine  PCP:  Charlotte Sanes, MD  Cardiologist:  No primary care provider on file. Primary HF: New- Bryce Cruz  Chief Complaint: Heart Failure follow-up   History of Present Illness:  [name] is a 75 y/o male with h/o colon cancer (resected 12/20. No chemo).  Admitted to [facility] 4/20  with acute HF. EF 20% by echo. Also found to have LBBB.   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 EF did not improve with PVC suppression and felt to be more likely LBBB CM.  cMRI 9/21: LVEF 29% with dyssynchrony. No LGE. RVEF 59%  Underwent BosSci CRT-D implant with Dr. Lovena Le on 08/27/20  Here for f/u. Feels good. Says dizziness resolved with thyroid meds. Very active in the yard.  No CP or SOB. No edema or PND  ICD interrogated: HL score 0. No VT/AF. Activity level 5.2h/day 97% LV pacing  Personally reviewed  Echo today 12/30/20 EF 55-60%  RV ok. Mild-moderate dilation of Asc Ao at 4.5 cm Personally reviewed    Cath 2/21:  1. Mild non-obstructive CAD 2. 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 Well-compensated hemodynamics with low filling pressures  Zio 2/21: 1. NSR - min HR of 39 bpm, avg HR of 67 bpm. 2. Two runs of NSVT  - the longest lasting 8 beats with an avg rate of 105 bpm.  3. 18 runs of SVT - the longest lasting 14.2 secs with an avg rate of 99 bpm. 4. Frequent PACs (5.3%, 34831)  5, Frequent PVCs (6.2%, 02585) with two priamry morphologies (5.0% and 1.2% respectively)  6. Difficulty discerning atrial activity making definitive diagnosis difficult to ascertain.   PMHx 1. LBBB (unknown duration) 2. Colon cancer s/p resection  No outpatient medications have  been marked as taking for the 12/30/20 encounter [facility]) with Bryce Cruz, Bryce Pascal, MD.    Allergies:  NKDA  Social History:  Occasional tobacco. No ETOH   Family History:  Mom - CAD s/p CABG deceased Dad - died from SCD (heavy smoker and drinker) Sister with PPM Younger sister is disabled  Vitals:   12/30/20 1056  BP: 140/68  Pulse: 63  SpO2: 98%    Exam:   General:  Well appearing. No resp difficulty HEENT: normal Neck: supple. no JVD. Carotids 2+ bilat; no bruits. No lymphadenopathy or thryomegaly appreciated. Cor: PMI nondisplaced. Regular rate & rhythm. No rubs, gallops or murmurs. Lungs: clear Abdomen: soft, nontender, nondistended. No hepatosplenomegaly. No bruits or masses. Good bowel sounds. Extremities: no cyanosis, clubbing, rash, edema Neuro: alert & orientedx3, cranial nerves grossly intact. moves all 4 extremities w/o difficulty. Affect pleasant   Recent Labs: 06/25/2020: B Natriuretic Peptide 480.5 08/26/2020: Hemoglobin 13.6; Platelets 227 09/25/2020: ALT 28; TSH 12.935 10/15/2020: BUN 24; Creatinine, Ser 1.54; Potassium 4.6; Sodium 141  Personally reviewed   Wt Readings from Last 3 Encounters:  12/02/20 76.2 kg (168 lb)  09/25/20 74.1 kg (163 lb 6.4 oz)  08/27/20 76.2 kg (168 lb)      ASSESSMENT AND PLAN:  1.  Chronic systolic HF due to NICM (suspect LBBB) - new diagnosis 4/20 - Cath 4/20. Minimal CAD. EF 20-25% - Etiology unclear. Possibly  LBBB-CM (was told his ECG was a bit abnormal prior to colon cancer surgery) vs frequent PVCs - PVCs suppressed with amio but EF still 25% with marked dyssynchrony (06/25/20) -> suspect LBBB CM - Echo 8/21 EF 25% - cMRI 9/21: LVEF 29% with dyssynchrony. No LGE. RVEF 59% - s/p BosSci CRT-D implant with Dr. Lovena Le on 08/27/20 - Echo today 12/30/20 EF 55-60%  RV ok. Mild-moderate dilation of Asc Ao at 4.5 cm Personally reviewed - ICD interrogated: HL score 0. No VT/AF. 97% LV pacing  Activity level 5.2h/day  Personally reviewed - NYHA I Volume status looks good. - Continue spiro 12.5 mg daily  - Continue Entresto 97/103 bid - Increase carvedilol to 6.25 mg bid - Continue lasix PRN only   2. Colon CA - s/p resection  - stable in remission - refused chemo  3. Frequent PVCs - quiescent - PVCs suppressed on exam  - Suspect LBBB caused NICM and probably not PVCs.  - Drop amio to 100 daily. Consider switch to mexilitene soon  4. Asc Ao dilation - Echo today 12/30/20 EF 55-60%  RV ok. Mild-moderate dilation of Asc Ao at 4.5 cm Personally reviewed - check CT  Signed, Glori Bickers, MD  12/29/2020 7:08 PM  Advanced Heart Failure [place] [address] Heart and [place] [address] [phone] (office) [phone] (fax)

## 2020-12-30 ENCOUNTER — Other Ambulatory Visit: Payer: Self-pay

## 2020-12-30 ENCOUNTER — Encounter (HOSPITAL_COMMUNITY): Payer: Self-pay | Admitting: Internal Medicine

## 2020-12-30 ENCOUNTER — Ambulatory Visit (HOSPITAL_COMMUNITY)
Admission: RE | Admit: 2020-12-30 | Discharge: 2020-12-30 | Disposition: A | Discharge status: Home / Self Care | Payer: PPO | Source: Ambulatory Visit | Attending: Internal Medicine | Admitting: Internal Medicine

## 2020-12-30 ENCOUNTER — Ambulatory Visit (HOSPITAL_BASED_OUTPATIENT_CLINIC_OR_DEPARTMENT_OTHER)
Admission: RE | Admit: 2020-12-30 | Discharge: 2020-12-30 | Disposition: A | Discharge status: Home / Self Care | Payer: PPO | Source: Ambulatory Visit | Attending: Internal Medicine | Admitting: Internal Medicine

## 2020-12-30 VITALS — BP 140/68 | HR 63 | Wt 170.4 lb

## 2020-12-30 DIAGNOSIS — I071 Rheumatic tricuspid insufficiency: Secondary | ICD-10-CM | POA: Diagnosis not present

## 2020-12-30 DIAGNOSIS — Z85038 Personal history of other malignant neoplasm of large intestine: Secondary | ICD-10-CM | POA: Insufficient documentation

## 2020-12-30 DIAGNOSIS — I2510 Atherosclerotic heart disease of native coronary artery without angina pectoris: Secondary | ICD-10-CM | POA: Diagnosis not present

## 2020-12-30 DIAGNOSIS — I428 Other cardiomyopathies: Secondary | ICD-10-CM | POA: Diagnosis not present

## 2020-12-30 DIAGNOSIS — I77810 Thoracic aortic ectasia: Secondary | ICD-10-CM | POA: Diagnosis not present

## 2020-12-30 DIAGNOSIS — I447 Left bundle-branch block, unspecified: Secondary | ICD-10-CM | POA: Diagnosis not present

## 2020-12-30 DIAGNOSIS — I77819 Aortic ectasia, unspecified site: Secondary | ICD-10-CM | POA: Insufficient documentation

## 2020-12-30 DIAGNOSIS — I493 Ventricular premature depolarization: Secondary | ICD-10-CM | POA: Diagnosis not present

## 2020-12-30 DIAGNOSIS — Z720 Tobacco use: Secondary | ICD-10-CM | POA: Diagnosis not present

## 2020-12-30 DIAGNOSIS — Z8249 Family history of ischemic heart disease and other diseases of the circulatory system: Secondary | ICD-10-CM | POA: Diagnosis not present

## 2020-12-30 DIAGNOSIS — I5022 Chronic systolic (congestive) heart failure: Secondary | ICD-10-CM | POA: Diagnosis not present

## 2020-12-30 DIAGNOSIS — Z950 Presence of cardiac pacemaker: Secondary | ICD-10-CM | POA: Diagnosis not present

## 2020-12-30 DIAGNOSIS — Z79899 Other long term (current) drug therapy: Secondary | ICD-10-CM | POA: Insufficient documentation

## 2020-12-30 LAB — ECHOCARDIOGRAM COMPLETE
Area-P 1/2: 3.53 cm2
Calc EF: 50.6 %
S' Lateral: 3.5 cm
Single Plane A2C EF: 55.6 %
Single Plane A4C EF: 49.6 %

## 2020-12-30 LAB — BASIC METABOLIC PANEL
Anion gap: 7 (ref 5–15)
BUN: 23 mg/dL (ref 8–23)
CO2: 21 mmol/L — ABNORMAL LOW (ref 22–32)
Calcium: 9.3 mg/dL (ref 8.9–10.3)
Chloride: 110 mmol/L (ref 98–111)
Creatinine, Ser: 1.45 mg/dL — ABNORMAL HIGH (ref 0.61–1.24)
GFR, Estimated: 50 mL/min — ABNORMAL LOW (ref >60)
Glucose, Bld: 107 mg/dL — ABNORMAL HIGH (ref 70–99)
Potassium: 5.8 mmol/L — ABNORMAL HIGH (ref 3.5–5.1)
Sodium: 138 mmol/L (ref 135–145)

## 2020-12-30 LAB — BRAIN NATRIURETIC PEPTIDE: B Natriuretic Peptide: 131.9 pg/mL — ABNORMAL HIGH (ref 0.0–100.0)

## 2020-12-30 MED ORDER — AMIODARONE HCL 100 MG PO TABS
200.0000 mg | ORAL_TABLET | Freq: Every day | ORAL | 11 refills | Status: DC
Start: 2020-12-30 — End: 2021-12-31

## 2020-12-30 MED ORDER — CARVEDILOL 6.25 MG PO TABS: 6.2500 mg | ORAL_TABLET | Freq: Two times a day (BID) | ORAL | 11 refills | Status: DC

## 2020-12-30 NOTE — Patient Instructions (Signed)
It was great to see you today! No medication changes are needed at this time.  Labs today We will only contact you if something comes back abnormal or we need to make some changes. Otherwise no news is good news!  Your physician has requested that you have cardiac CT. Cardiac computed tomography (CT) is a painless test that uses an x-ray machine to take clear, detailed pictures of your heart. For further information please visit [email or web address]. Please follow instruction sheet as given.   Your physician recommends that you schedule a follow-up appointment in: 6 months with Dr Haroldine Laws  At the [facility], you and your health needs are our priority. As part of our continuing mission to provide you with exceptional heart care, we have created designated Provider Care Teams. These Care Teams include your primary Cardiologist (physician) and Advanced Practice Providers (APPs- Physician Assistants and Nurse Practitioners) who all work together to provide you with the care you need, when you need it.   You may see any of the following providers on your designated Care Team at your next follow up: Marland Kitchen Dr Glori Bickers . Dr Loralie Champagne . Dr Vickki Muff . Darrick Grinder, NP . Lyda Jester, [place] . Audry Riles, PharmD   Please be sure to bring in all your medications bottles to every appointment.   If you have any questions or concerns before your next appointment please send [place] a message through [place] or call our office at [phone].    TO LEAVE A MESSAGE FOR THE NURSE SELECT OPTION 2, PLEASE LEAVE A MESSAGE INCLUDING: . YOUR NAME . DATE OF BIRTH . CALL BACK NUMBER . REASON FOR CALL**this is important as we prioritize the call backs  YOU WILL RECEIVE A CALL BACK THE SAME DAY AS LONG AS YOU CALL BEFORE 4:00 PM

## 2020-12-30 NOTE — Addendum Note (Signed)
Encounter addended by: Kerry Dory, CMA on: 12/30/2020 11:53 AM  Actions taken: Actions taken from a BestPractice Advisory, Order list changed, Diagnosis association updated, Clinical Note Signed, Charge Capture section accepted

## 2020-12-30 NOTE — Progress Notes (Signed)
  Echocardiogram 2D Echocardiogram has been performed.  Bryce Cruz 12/30/2020, 10:52 AM

## 2020-12-31 ENCOUNTER — Telehealth (HOSPITAL_COMMUNITY): Payer: Self-pay | Admitting: Cardiology

## 2020-12-31 NOTE — Telephone Encounter (Signed)
-----   Message from Jolaine Artist, MD sent at 12/30/2020  4:00 PM EST ----- Potassium high. Stop spiro. Repeat BMET on Friday. Avoid any potassium supplements

## 2020-12-31 NOTE — Telephone Encounter (Signed)
Pt aware and voiced understanding Will stop banana sandwiches, multi vitamin, zinc/potassium/vitamin d supplement etc Reports he will have labs repeated at pcp

## 2021-01-15 ENCOUNTER — Ambulatory Visit (HOSPITAL_COMMUNITY)
Admission: RE | Admit: 2021-01-15 | Discharge: 2021-01-15 | Disposition: A | Discharge status: Home / Self Care | Payer: PPO | Source: Ambulatory Visit | Attending: Internal Medicine | Admitting: Internal Medicine

## 2021-01-15 ENCOUNTER — Other Ambulatory Visit: Payer: Self-pay

## 2021-01-15 ENCOUNTER — Other Ambulatory Visit (HOSPITAL_COMMUNITY): Payer: Self-pay | Admitting: *Deleted

## 2021-01-15 DIAGNOSIS — I428 Other cardiomyopathies: Secondary | ICD-10-CM | POA: Diagnosis not present

## 2021-01-15 DIAGNOSIS — I509 Heart failure, unspecified: Secondary | ICD-10-CM | POA: Diagnosis not present

## 2021-01-15 DIAGNOSIS — I77810 Thoracic aortic ectasia: Secondary | ICD-10-CM | POA: Diagnosis not present

## 2021-01-15 LAB — BASIC METABOLIC PANEL
Anion gap: 5 (ref 5–15)
BUN: 22 mg/dL (ref 8–23)
CO2: 25 mmol/L (ref 22–32)
Calcium: 9.6 mg/dL (ref 8.9–10.3)
Chloride: 112 mmol/L — ABNORMAL HIGH (ref 98–111)
Creatinine, Ser: 1.48 mg/dL — ABNORMAL HIGH (ref 0.61–1.24)
GFR, Estimated: 49 mL/min — ABNORMAL LOW (ref >60)
Glucose, Bld: 111 mg/dL — ABNORMAL HIGH (ref 70–99)
Potassium: 4.5 mmol/L (ref 3.5–5.1)
Sodium: 142 mmol/L (ref 135–145)

## 2021-01-15 MED ORDER — IOHEXOL 350 MG/ML SOLN
100.0000 mL | Freq: Once | INTRAVENOUS | Status: AC | PRN
  Administered 2021-01-15: 100 mL via INTRAVENOUS

## 2021-01-31 ENCOUNTER — Encounter (HOSPITAL_COMMUNITY): Payer: Self-pay | Admitting: *Deleted

## 2021-02-04 IMAGING — CR DG CHEST 2V
2 series · 2 of 2 positions shown · non-contrast
Comparison: February 17, 2019.

CLINICAL DATA: Defibrillator placement.

EXAM:
CHEST - 2 VIEW

[w chest pa]
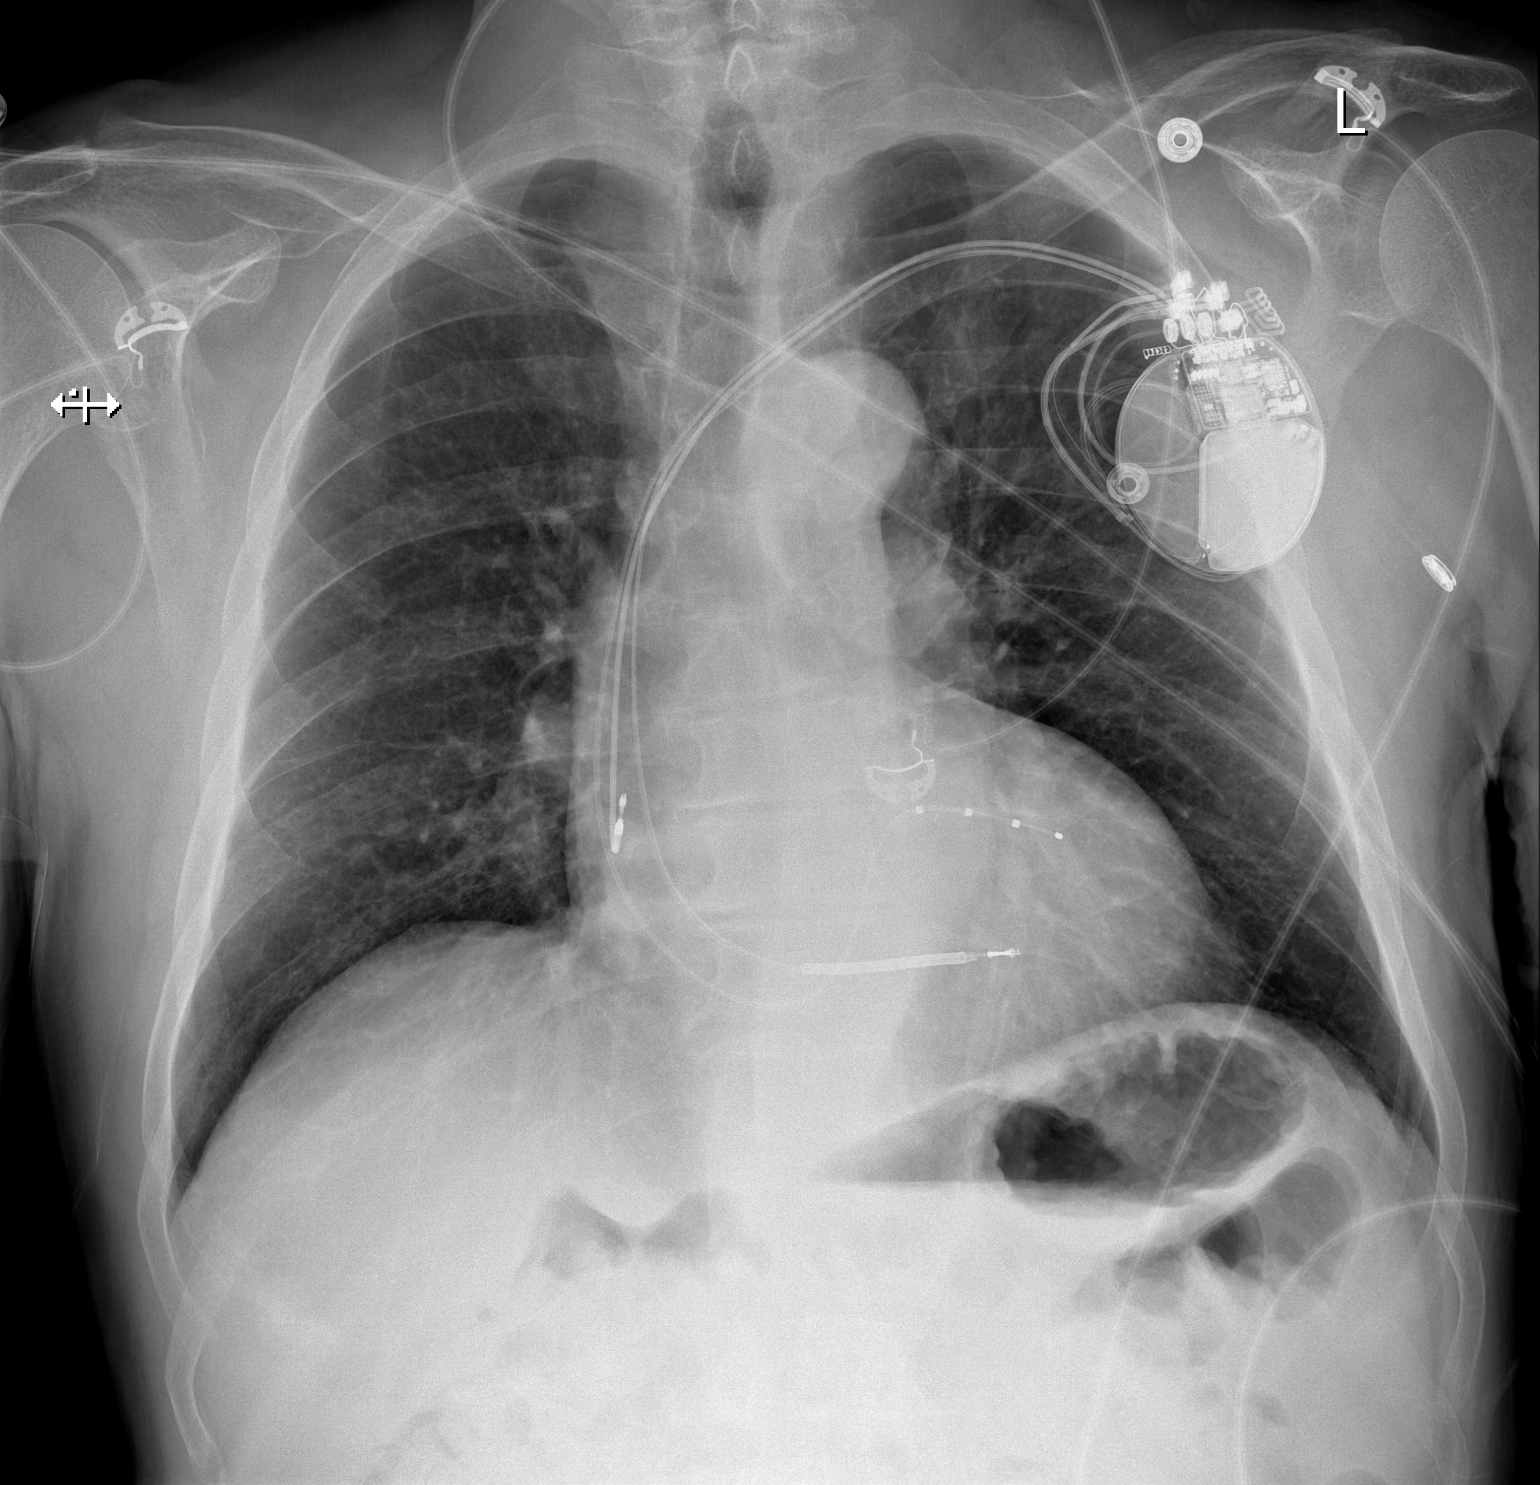

[w chest lat]
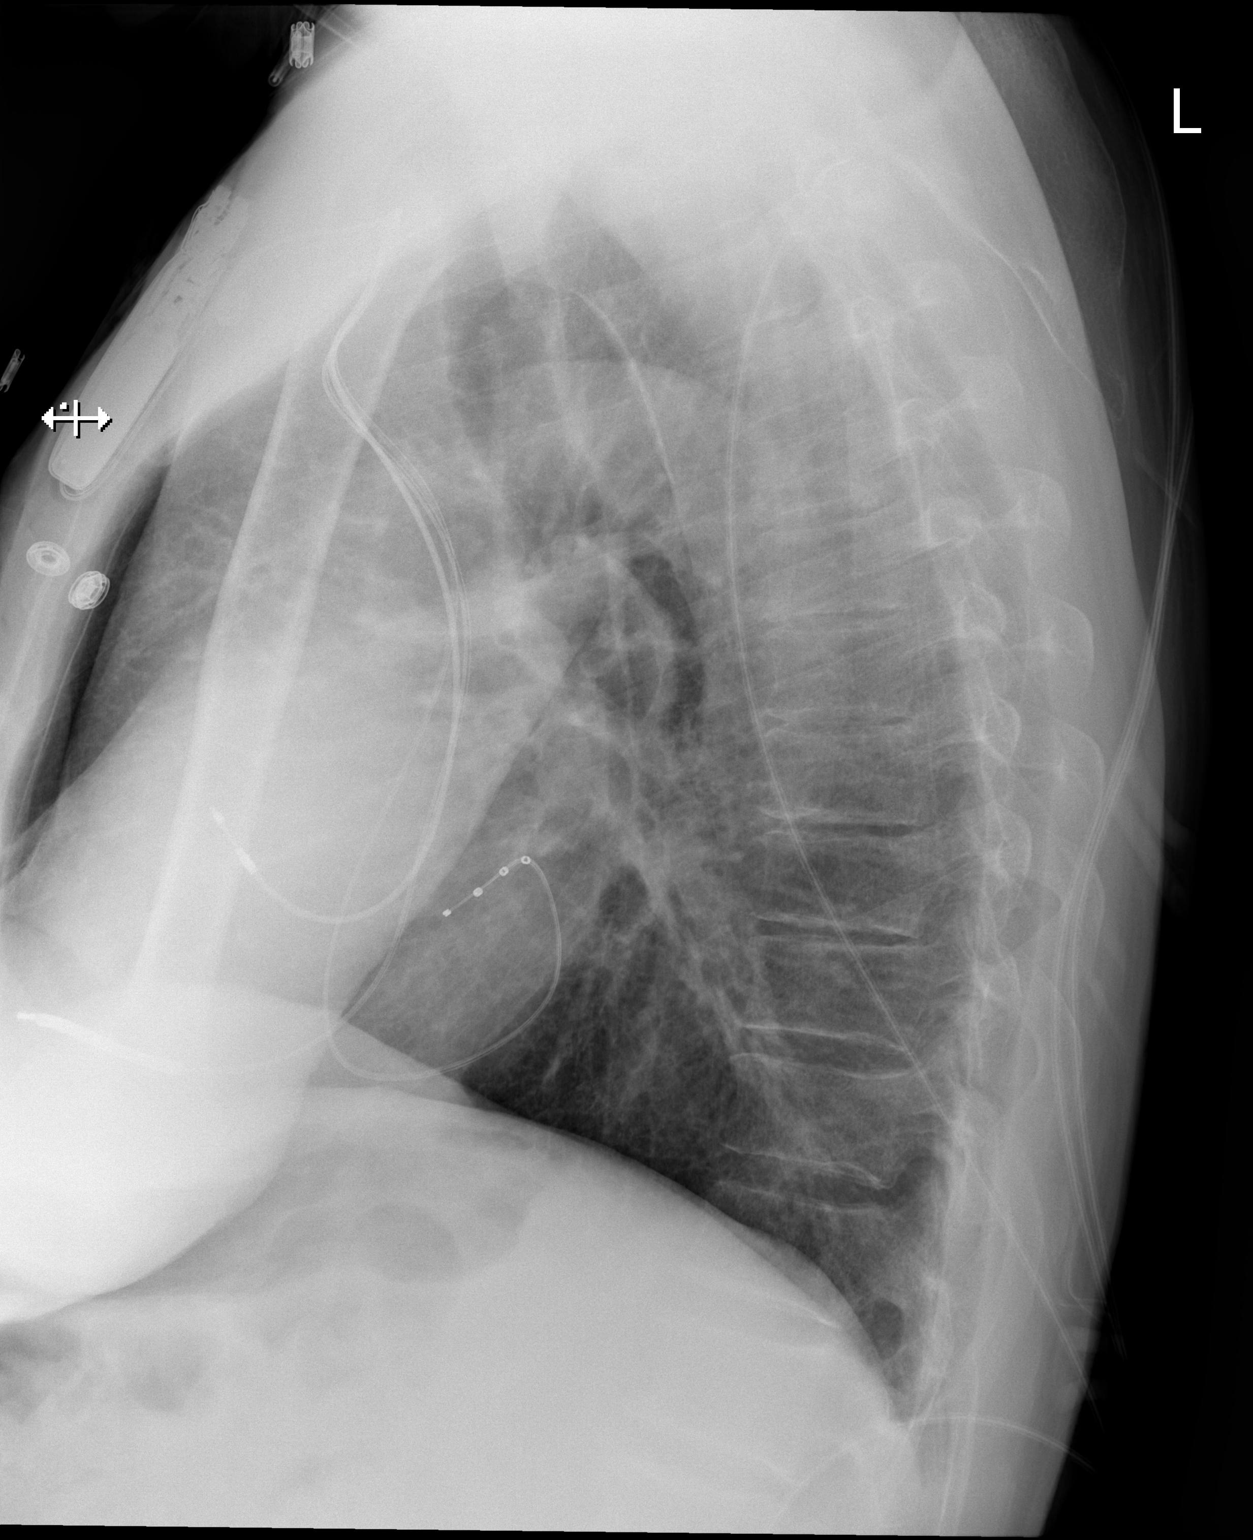

[2 of 2 positions shown; findings below may reference images not displayed]

FINDINGS: The heart size and mediastinal contours are within normal limits.
Interval placement of left-sided pacemaker with leads in grossly
good position. No pneumothorax or pleural effusion is noted. Both
lungs are clear. The visualized skeletal structures are
unremarkable.
IMPRESSION: No active cardiopulmonary disease.

## 2021-02-17 ENCOUNTER — Other Ambulatory Visit (HOSPITAL_COMMUNITY): Payer: Self-pay | Admitting: Internal Medicine

## 2021-02-24 ENCOUNTER — Ambulatory Visit (INDEPENDENT_AMBULATORY_CARE_PROVIDER_SITE_OTHER): Payer: PPO

## 2021-02-24 DIAGNOSIS — I428 Other cardiomyopathies: Secondary | ICD-10-CM

## 2021-02-25 LAB — CUP PACEART REMOTE DEVICE CHECK
Battery Remaining Longevity: 120 mo
Battery Remaining Percentage: 100 %
Brady Statistic RA Percent Paced: 16 %
Brady Statistic RV Percent Paced: 95 %
Date Time Interrogation Session: 20220502041100
HighPow Impedance: 66 Ohm
Implantable Lead Implant Date: 20211102
Implantable Lead Implant Date: 20211102
Implantable Lead Implant Date: 20211102
Implantable Lead Location: 753858
Implantable Lead Location: 753859
Implantable Lead Location: 753860
Implantable Lead Model: 137
Implantable Lead Model: 4671
Implantable Lead Model: 7840
Implantable Pulse Generator Implant Date: 20211102
Lead Channel Impedance Value: 451 Ohm
Lead Channel Impedance Value: 483 Ohm
Lead Channel Impedance Value: 516 Ohm
Lead Channel Pacing Threshold Amplitude: 0.7 V
Lead Channel Pacing Threshold Amplitude: 0.9 V
Lead Channel Pacing Threshold Pulse Width: 0.4 ms
Lead Channel Pacing Threshold Pulse Width: 0.4 ms
Lead Channel Setting Pacing Amplitude: 2 V
Lead Channel Setting Pacing Amplitude: 2 V
Lead Channel Setting Pacing Amplitude: 2.5 V
Lead Channel Setting Pacing Pulse Width: 0.4 ms
Lead Channel Setting Pacing Pulse Width: 1 ms
Lead Channel Setting Sensing Sensitivity: 0.5 mV
Lead Channel Setting Sensing Sensitivity: 1 mV
Pulse Gen Serial Number: 387447

## 2021-03-17 DIAGNOSIS — N401 Enlarged prostate with lower urinary tract symptoms: Secondary | ICD-10-CM | POA: Diagnosis not present

## 2021-03-17 DIAGNOSIS — R339 Retention of urine, unspecified: Secondary | ICD-10-CM | POA: Diagnosis not present

## 2021-03-17 NOTE — Progress Notes (Signed)
Remote ICD transmission.   

## 2021-05-26 ENCOUNTER — Ambulatory Visit (INDEPENDENT_AMBULATORY_CARE_PROVIDER_SITE_OTHER): Payer: PPO

## 2021-05-26 DIAGNOSIS — I428 Other cardiomyopathies: Secondary | ICD-10-CM | POA: Diagnosis not present

## 2021-05-26 DIAGNOSIS — I5022 Chronic systolic (congestive) heart failure: Secondary | ICD-10-CM

## 2021-05-26 LAB — CUP PACEART REMOTE DEVICE CHECK
Battery Remaining Longevity: 126 mo
Battery Remaining Percentage: 100 %
Brady Statistic RA Percent Paced: 19 %
Brady Statistic RV Percent Paced: 95 %
Date Time Interrogation Session: 20220801041100
HighPow Impedance: 67 Ohm
Implantable Lead Implant Date: 20211102
Implantable Lead Implant Date: 20211102
Implantable Lead Implant Date: 20211102
Implantable Lead Location: 753858
Implantable Lead Location: 753859
Implantable Lead Location: 753860
Implantable Lead Model: 137
Implantable Lead Model: 4671
Implantable Lead Model: 7840
Implantable Pulse Generator Implant Date: 20211102
Lead Channel Impedance Value: 479 Ohm
Lead Channel Impedance Value: 512 Ohm
Lead Channel Impedance Value: 567 Ohm
Lead Channel Pacing Threshold Amplitude: 0.5 V
Lead Channel Pacing Threshold Amplitude: 0.9 V
Lead Channel Pacing Threshold Pulse Width: 0.4 ms
Lead Channel Pacing Threshold Pulse Width: 0.4 ms
Lead Channel Setting Pacing Amplitude: 2 V
Lead Channel Setting Pacing Amplitude: 2 V
Lead Channel Setting Pacing Amplitude: 2.5 V
Lead Channel Setting Pacing Pulse Width: 0.4 ms
Lead Channel Setting Pacing Pulse Width: 1 ms
Lead Channel Setting Sensing Sensitivity: 0.5 mV
Lead Channel Setting Sensing Sensitivity: 1 mV
Pulse Gen Serial Number: 387447

## 2021-06-19 NOTE — Progress Notes (Signed)
Remote ICD transmission.   

## 2021-06-23 ENCOUNTER — Other Ambulatory Visit (HOSPITAL_COMMUNITY): Payer: Self-pay | Admitting: Internal Medicine

## 2021-06-27 ENCOUNTER — Other Ambulatory Visit (HOSPITAL_COMMUNITY): Payer: Self-pay

## 2021-06-27 ENCOUNTER — Telehealth (HOSPITAL_COMMUNITY): Payer: Self-pay | Admitting: Pharmacy Technician

## 2021-06-27 NOTE — Telephone Encounter (Signed)
Advanced Heart Failure Patient Advocate Encounter  Patient called and left a message that he was in the donut hole and could not afford Entresto. Was able to get a PAN HF grant to help cover the cost of the medication.  Member ID: [id number] Group ID: [id number] RxBin ID: [id number] PCN: [id number] Eligibility Start Date: 03/29/2021 Eligibility End Date: 06/26/2022 Assistance Amount: $1,000.00  Called and spoke with the patient. Conservator, museum/gallery.  Charlann Boxer, CPhT

## 2021-07-01 ENCOUNTER — Telehealth (HOSPITAL_COMMUNITY): Payer: Self-pay | Admitting: Pharmacy Technician

## 2021-07-01 NOTE — Telephone Encounter (Signed)
Advanced Heart Failure Patient Advocate Encounter  Patient called instating that the pharmacy claims they did not get the billing information that was attached to the [employer]   I reminded the patient I provided him with a copy of the grant information for the future in case this happens.   Called and confirmed the pharmacy will save the billing information for future use.   Charlann Boxer, CPhT

## 2021-07-29 DIAGNOSIS — E039 Hypothyroidism, unspecified: Secondary | ICD-10-CM | POA: Diagnosis not present

## 2021-07-29 DIAGNOSIS — Z125 Encounter for screening for malignant neoplasm of prostate: Secondary | ICD-10-CM | POA: Diagnosis not present

## 2021-07-29 DIAGNOSIS — I509 Heart failure, unspecified: Secondary | ICD-10-CM | POA: Diagnosis not present

## 2021-07-29 DIAGNOSIS — Z0000 Encounter for general adult medical examination without abnormal findings: Secondary | ICD-10-CM | POA: Diagnosis not present

## 2021-07-29 DIAGNOSIS — E876 Hypokalemia: Secondary | ICD-10-CM | POA: Diagnosis not present

## 2021-07-29 DIAGNOSIS — I10 Essential (primary) hypertension: Secondary | ICD-10-CM | POA: Diagnosis not present

## 2021-07-29 DIAGNOSIS — R739 Hyperglycemia, unspecified: Secondary | ICD-10-CM | POA: Diagnosis not present

## 2021-07-29 DIAGNOSIS — R7301 Impaired fasting glucose: Secondary | ICD-10-CM | POA: Diagnosis not present

## 2021-07-29 DIAGNOSIS — Z85038 Personal history of other malignant neoplasm of large intestine: Secondary | ICD-10-CM | POA: Diagnosis not present

## 2021-08-25 ENCOUNTER — Ambulatory Visit (INDEPENDENT_AMBULATORY_CARE_PROVIDER_SITE_OTHER): Payer: PPO

## 2021-08-25 DIAGNOSIS — I428 Other cardiomyopathies: Secondary | ICD-10-CM

## 2021-08-25 LAB — CUP PACEART REMOTE DEVICE CHECK
Battery Remaining Longevity: 120 mo
Battery Remaining Percentage: 100 %
Brady Statistic RA Percent Paced: 21 %
Brady Statistic RV Percent Paced: 95 %
Date Time Interrogation Session: 20221031042500
HighPow Impedance: 64 Ohm
Implantable Lead Implant Date: 20211102
Implantable Lead Implant Date: 20211102
Implantable Lead Implant Date: 20211102
Implantable Lead Location: 753858
Implantable Lead Location: 753859
Implantable Lead Location: 753860
Implantable Lead Model: 137
Implantable Lead Model: 4671
Implantable Lead Model: 7840
Implantable Pulse Generator Implant Date: 20211102
Lead Channel Impedance Value: 479 Ohm
Lead Channel Impedance Value: 485 Ohm
Lead Channel Impedance Value: 611 Ohm
Lead Channel Pacing Threshold Amplitude: 0.7 V
Lead Channel Pacing Threshold Amplitude: 0.9 V
Lead Channel Pacing Threshold Pulse Width: 0.4 ms
Lead Channel Pacing Threshold Pulse Width: 0.4 ms
Lead Channel Setting Pacing Amplitude: 2 V
Lead Channel Setting Pacing Amplitude: 2 V
Lead Channel Setting Pacing Amplitude: 2.5 V
Lead Channel Setting Pacing Pulse Width: 0.4 ms
Lead Channel Setting Pacing Pulse Width: 1 ms
Lead Channel Setting Sensing Sensitivity: 0.5 mV
Lead Channel Setting Sensing Sensitivity: 1 mV
Pulse Gen Serial Number: 387447

## 2021-08-29 DIAGNOSIS — M722 Plantar fascial fibromatosis: Secondary | ICD-10-CM | POA: Diagnosis not present

## 2021-09-02 NOTE — Progress Notes (Signed)
Remote ICD transmission.   

## 2021-09-22 DIAGNOSIS — N401 Enlarged prostate with lower urinary tract symptoms: Secondary | ICD-10-CM | POA: Diagnosis not present

## 2021-09-22 DIAGNOSIS — R339 Retention of urine, unspecified: Secondary | ICD-10-CM | POA: Diagnosis not present

## 2021-10-02 DIAGNOSIS — H3561 Retinal hemorrhage, right eye: Secondary | ICD-10-CM | POA: Diagnosis not present

## 2021-10-02 DIAGNOSIS — Z961 Presence of intraocular lens: Secondary | ICD-10-CM | POA: Diagnosis not present

## 2021-10-02 DIAGNOSIS — Z09 Encounter for follow-up examination after completed treatment for conditions other than malignant neoplasm: Secondary | ICD-10-CM | POA: Diagnosis not present

## 2021-10-02 DIAGNOSIS — H18003 Unspecified corneal deposit, bilateral: Secondary | ICD-10-CM | POA: Diagnosis not present

## 2021-10-08 DIAGNOSIS — Z85038 Personal history of other malignant neoplasm of large intestine: Secondary | ICD-10-CM | POA: Diagnosis not present

## 2021-10-28 ENCOUNTER — Other Ambulatory Visit (HOSPITAL_COMMUNITY): Payer: Self-pay | Admitting: Internal Medicine

## 2021-10-31 ENCOUNTER — Other Ambulatory Visit (HOSPITAL_COMMUNITY): Payer: Self-pay | Admitting: Internal Medicine

## 2021-11-24 ENCOUNTER — Ambulatory Visit (INDEPENDENT_AMBULATORY_CARE_PROVIDER_SITE_OTHER): Payer: PPO

## 2021-11-24 DIAGNOSIS — I428 Other cardiomyopathies: Secondary | ICD-10-CM

## 2021-11-25 LAB — CUP PACEART REMOTE DEVICE CHECK
Date Time Interrogation Session: 20230131074245
Implantable Lead Implant Date: 20211102
Implantable Lead Implant Date: 20211102
Implantable Lead Implant Date: 20211102
Implantable Lead Location: 753858
Implantable Lead Location: 753859
Implantable Lead Location: 753860
Implantable Lead Model: 137
Implantable Lead Model: 4671
Implantable Lead Model: 7840
Implantable Pulse Generator Implant Date: 20211102
Pulse Gen Serial Number: 387447

## 2021-12-02 NOTE — Progress Notes (Signed)
Remote ICD transmission.   

## 2021-12-28 NOTE — Progress Notes (Signed)
Cardiology Office Note Date:  12/31/2021  Patient ID:  Bryce Cruz, Bryce Cruz 1946/02/06, MRN [id number] PCP:  Bryce Sanes, MD  Cardiologist:  Dr. Haroldine Laws Electrophysiologist: Dr. Lovena Le    Chief Complaint:  annual visit  History of Present Illness: Bryce Cruz is a 76 y.o. male with history of colon ca (tx surgically), LBBB, PVCs, NICM, chronic CHF (systolic).  He comes in today to be seen for Dr. Lovena Le, last seen by him Feb 2022.  He was doing well with class II symptoms, continued to work part time.  LV>RV delay made 20seconds earlier.  BP a but high and encouraged low salt diet.  More recently he saw Dr. Haroldine Laws March 2022.  Coreg was further titrated upwards, amiodarone reduced with mention pf switching to mexiletine.  Echo that day noted Ascending Ao 4.5cm, planned for CT  CT noted 4.1-4.2cm  Ascending AO  TODAY He is doing great. 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 He denies any CP, palpitations or cardiac awareness No SOB, DOE No near syncope or syncope, occasionally gets a little lightheaded with standing   Device information [facility] CRT-D implanted 08/27/20 (LV lead is in CS position)   AAD hx Amiodarone for PVCs started 03/2020   Past Medical History:  Diagnosis Date   CHF (congestive heart failure) ([place])     Past Surgical History:  Procedure Laterality Date   BIV ICD INSERTION CRT-D N/A 08/27/2020   Procedure: BIV ICD INSERTION CRT-D;  Surgeon: Evans Lance, MD;  Location: [facility];  Service: Cardiovascular;  Laterality: N/A;   RIGHT/LEFT HEART CATH AND CORONARY ANGIOGRAPHY N/A 12/19/2019   Procedure: RIGHT/LEFT HEART CATH AND CORONARY ANGIOGRAPHY;  Surgeon: Jolaine Artist, MD;  Location: [facility];  Service: Cardiovascular;  Laterality: N/A;    Current Outpatient Medications  Medication Sig Dispense Refill   alfuzosin (UROXATRAL) 10 MG  24 hr tablet Take 10 mg by mouth daily as needed (Fluid retention).      amiodarone (PACERONE) 200 MG tablet Take 200 mg by mouth daily.     aspirin EC 81 MG tablet Take 81 mg by mouth daily.     carvedilol (COREG) 6.25 MG tablet Take 1 tablet (6.25 mg total) by mouth 2 (two) times daily. 60 tablet 11   fluticasone (FLONASE) 50 MCG/ACT nasal spray Place 1 spray into both nostrils daily as needed for allergies or rhinitis.     furosemide (LASIX) 40 MG tablet Take 1 tablet (40 mg total) by mouth daily as needed. 30 tablet    Multiple Vitamin (MULTIVITAMIN WITH MINERALS) TABS tablet Take 1 tablet by mouth daily.     naproxen (NAPROSYN) 500 MG tablet Take 500 mg by mouth 2 (two) times daily as needed. Pain     sacubitril-valsartan (ENTRESTO) 97-103 MG Take 1 tablet by mouth 2 (two) times daily. YOU NEED AN OFFICE VISIT FOR MORE REFILLS CALL OFFICE TO ARRANGE 60 tablet 3   SYNTHROID 50 MCG tablet Take 1 tablet (50 mcg total) by mouth daily before breakfast. NEEDS OFFICE VISIT FOR MORE REFILLS 30 tablet 0   zolpidem (AMBIEN) 5 MG tablet Take 2.5 mg by mouth at bedtime as needed for sleep.      No current facility-administered medications for this visit.    Allergies:   Patient has no known allergies.   Social History:  The patient  reports that he has been smoking. He  has never used smokeless tobacco. He reports that he does not drink alcohol.   Family History:  The patient's family history is not on file.  ROS:  Please see the history of present illness.    All other systems are reviewed and otherwise negative.   PHYSICAL EXAM:  VS:  BP (!) 160/80    Pulse (!) 50    Ht '5\' 7"'$  (1.702 m)    Wt 180 lb 6.4 oz (81.8 kg)    SpO2 97%    BMI 28.25 kg/m  BMI: Body mass index is 28.25 kg/m. Well nourished, well developed, in no acute distress HEENT: normocephalic, atraumatic Neck: no JVD, carotid bruits or masses Cardiac:  RRR; no significant murmurs, no rubs, or gallops Lungs:  CTA b/l, no wheezing,  rhonchi or rales Abd: soft, nontender MS: no deformity or atrophy Ext:  no edema Skin: warm and dry, no rash Neuro:  No gross deficits appreciated Psych: euthymic mood, full affect  ICD site is stable, no tethering or discomfort   EKG:  Done today and reviewed by myself shows  AV paced, 50bpm, PVC + lead I, tiny R wave V1, QRS 164  Device interrogation done today and reviewed by myself:  Battery and lead measurements are ok LV lead threshold is up from last, though still within 0.5V safety margin on his outputs, not changed 95%BP Heart logic is zero   01/15/21: CT chest IMPRESSION: 1. Mild aneurysmal disease of the ascending thoracic aorta with maximal caliber of approximately 4.1-4.2 cm. Accurate measurement of the proximal ascending thoracic aorta is difficult due to pulsation artifact. Recommend annual imaging followup by CTA or MRA. This recommendation follows 2010 ACCF/AHA/AATS/ACR/ASA/SCA/SCAI/SIR/STS/SVM Guidelines for the Diagnosis and Management of Patients with Thoracic Aortic Disease. Circulation. 2010; 121: S341-D622. Aortic aneurysm NOS (ICD10-I71.9) 2. Coronary atherosclerosis with visualized calcified coronary artery plaque. 3. Mild cardiac enlargement.  Indwelling biventricular pacemaker.   Echo 12/30/20 as per Dr. Clayborne Dana read/note EF 55-60%  RV ok. Mild-moderate dilation of Asc Ao at 4.5 cm    12/30/20: TTE IMPRESSIONS   1. Left ventricular ejection fraction, by estimation, is 55 to 60%. The  left ventricle has normal function. The left ventricle has no regional  wall motion abnormalities. There is mild left ventricular hypertrophy.  Left ventricular diastolic parameters  are consistent with Grade I diastolic dysfunction (impaired relaxation).   2. Right ventricular systolic function is normal. The right ventricular  size is normal. There is normal pulmonary artery systolic pressure.   3. Left atrial size was mildly dilated.   4. The mitral valve is  normal in structure. Trivial mitral valve  regurgitation. No evidence of mitral stenosis.   5. The aortic valve is tricuspid. There is mild calcification of the  aortic valve. Aortic valve regurgitation is not visualized. No aortic  stenosis is present.   6. Aortic dilatation noted. There is borderline dilatation of the aortic  root, measuring 39 mm. There is moderate dilatation of the ascending  aorta, measuring 45 mm.   7. The inferior vena cava is normal in size with greater than 50%  respiratory variability, suggesting right atrial pressure of 3 mmHg.    cMRI 9/21: LVEF 29% with dyssynchrony. No LGE. RVEF 59%  Cath 2/21:  1. Mild non-obstructive CAD 2. 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 Well-compensated hemodynamics with low filling pressures   Zio 2/21: 1. NSR - min HR of 39 bpm, avg HR of 67 bpm. 2. Two runs of NSVT  -  the longest lasting 8 beats with an avg rate of 105 bpm.  3. 18 runs of SVT - the longest lasting 14.2 secs with an avg rate of 99 bpm. 4. Frequent PACs (5.3%, 34831)  5, Frequent PVCs (6.2%, 58099) with two priamry morphologies (5.0% and 1.2% respectively)  6. Difficulty discerning atrial activity making definitive diagnosis difficult to ascertain.  Recent Labs: 01/15/2021: BUN 22; Creatinine, Ser 1.48; Potassium 4.5; Sodium 142  No results found for requested labs within last 8760 hours.   CrCl cannot be calculated (Patient's most recent lab result is older than the maximum 21 days allowed.).   Wt Readings from Last 3 Encounters:  12/31/21 180 lb 6.4 oz (81.8 kg)  12/30/20 170 lb 6.4 oz (77.3 kg)  12/02/20 168 lb (76.2 kg)     Other studies reviewed: Additional studies/records reviewed today include: summarized above  ASSESSMENT AND PLAN:  ICD Intact function LV lead threshold higher then last year though programmed with a good margin QRS is wider, though heart logic is great, feels great, last EF was recovered I will see him back in  30mo revisit his LV lead and EKG   NICM Chronic CHF Recovered LVEF by his echo last year 95 % BP No symptoms or exam findings of volume OL HF data looks good On BB, Entresto Sees HF team in May  PVCs Amiodarone 95% paced, assumed 5% PVC burden Labs today  Dilated ascending Ao Update CT  6. High BP Home numbers are reported as much better He will keep an eye at home, discussed importance of good BP control    Disposition: F/u with remotes, I will see him in 332moe-look at his LV lead/EKG, hemodynamics  Current medicines are reviewed at length with the patient today.  The patient did not have any concerns regarding medicines.  SiVenetia NightPA-C 12/31/2021 8:27 AM     [facility] [place] [phone])  [phone])

## 2021-12-31 ENCOUNTER — Ambulatory Visit: Payer: PPO | Admitting: Physician Assistant

## 2021-12-31 ENCOUNTER — Other Ambulatory Visit: Payer: Self-pay

## 2021-12-31 ENCOUNTER — Other Ambulatory Visit (HOSPITAL_COMMUNITY): Payer: Self-pay | Admitting: Internal Medicine

## 2021-12-31 ENCOUNTER — Encounter: Payer: Self-pay | Admitting: Physician Assistant

## 2021-12-31 VITALS — BP 160/80 | HR 50 | Ht 67.0 in | Wt 180.4 lb

## 2021-12-31 DIAGNOSIS — I5022 Chronic systolic (congestive) heart failure: Secondary | ICD-10-CM | POA: Diagnosis not present

## 2021-12-31 DIAGNOSIS — I428 Other cardiomyopathies: Secondary | ICD-10-CM

## 2021-12-31 DIAGNOSIS — I77810 Thoracic aortic ectasia: Secondary | ICD-10-CM | POA: Diagnosis not present

## 2021-12-31 DIAGNOSIS — Z95810 Presence of automatic (implantable) cardiac defibrillator: Secondary | ICD-10-CM

## 2021-12-31 DIAGNOSIS — I493 Ventricular premature depolarization: Secondary | ICD-10-CM

## 2021-12-31 LAB — CUP PACEART INCLINIC DEVICE CHECK
Date Time Interrogation Session: 20230308183554
HighPow Impedance: 61 Ohm
Implantable Lead Implant Date: 20211102
Implantable Lead Implant Date: 20211102
Implantable Lead Implant Date: 20211102
Implantable Lead Location: 753858
Implantable Lead Location: 753859
Implantable Lead Location: 753860
Implantable Lead Model: 137
Implantable Lead Model: 4671
Implantable Lead Model: 7840
Implantable Pulse Generator Implant Date: 20211102
Lead Channel Impedance Value: 504 Ohm
Lead Channel Impedance Value: 530 Ohm
Lead Channel Impedance Value: 693 Ohm
Lead Channel Pacing Threshold Amplitude: 0.8 V
Lead Channel Pacing Threshold Amplitude: 0.8 V
Lead Channel Pacing Threshold Amplitude: 1.5 V
Lead Channel Pacing Threshold Pulse Width: 0.4 ms
Lead Channel Pacing Threshold Pulse Width: 0.4 ms
Lead Channel Pacing Threshold Pulse Width: 1 ms
Lead Channel Sensing Intrinsic Amplitude: 20.1 mV
Lead Channel Sensing Intrinsic Amplitude: 4.3 mV
Lead Channel Sensing Intrinsic Amplitude: 8.9 mV
Lead Channel Setting Pacing Amplitude: 2 V
Lead Channel Setting Pacing Amplitude: 2 V
Lead Channel Setting Pacing Amplitude: 2.5 V
Lead Channel Setting Pacing Pulse Width: 0.4 ms
Lead Channel Setting Pacing Pulse Width: 1 ms
Lead Channel Setting Sensing Sensitivity: 0.5 mV
Lead Channel Setting Sensing Sensitivity: 1 mV
Pulse Gen Serial Number: 387447

## 2021-12-31 LAB — COMPREHENSIVE METABOLIC PANEL
ALT: 29 IU/L (ref 0–44)
AST: 33 IU/L (ref 0–40)
Albumin/Globulin Ratio: 2.2 (ref 1.2–2.2)
Albumin: 4.4 g/dL (ref 3.7–4.7)
Alkaline Phosphatase: 81 IU/L (ref 44–121)
BUN/Creatinine Ratio: 18 (ref 10–24)
BUN: 26 mg/dL (ref 8–27)
Bilirubin Total: 0.5 mg/dL (ref 0.0–1.2)
CO2: 22 mmol/L (ref 20–29)
Calcium: 9.4 mg/dL (ref 8.6–10.2)
Chloride: 109 mmol/L — ABNORMAL HIGH (ref 96–106)
Creatinine, Ser: 1.46 mg/dL — ABNORMAL HIGH (ref 0.76–1.27)
Globulin, Total: 2 g/dL (ref 1.5–4.5)
Glucose: 109 mg/dL — ABNORMAL HIGH (ref 70–99)
Potassium: 5 mmol/L (ref 3.5–5.2)
Sodium: 143 mmol/L (ref 134–144)
Total Protein: 6.4 g/dL (ref 6.0–8.5)
eGFR: 50 mL/min/{1.73_m2} — ABNORMAL LOW (ref >59)

## 2021-12-31 LAB — TSH: TSH: 19.2 u[IU]/mL — ABNORMAL HIGH (ref 0.450–4.500)

## 2021-12-31 NOTE — Patient Instructions (Addendum)
Medication Instructions:  ? ?Your physician recommends that you continue on your current medications as directed. Please refer to the Current Medication list given to you today. ? ? ?*If you need a refill on your cardiac medications before your next appointment, please call your pharmacy* ? ? ?Lab Work: CMET AND TSH TODAY  ? ?If you have labs (blood work) drawn today and your tests are completely normal, you will receive your results only by: ?MyChart Message (if you have MyChart) OR ?A paper copy in the mail ?If you have any lab test that is abnormal or we need to change your treatment, we will call you to review the results. ? ? ?Testing/Procedures: Non-Cardiac CT Angiography (CTA), is a special type of CT scan that uses a computer to produce multi-dimensional views of major blood vessels throughout the body. In CT angiography, a contrast material is injected through an IV to help visualize the blood vessels ? ? ? ? ?Follow-Up: ?At [facility], you and your health needs are our priority.  As part of our continuing mission to provide you with exceptional heart care, we have created designated Provider Care Teams.  These Care Teams include your primary Cardiologist (physician) and Advanced Practice Providers (APPs -  Physician Assistants and Nurse Practitioners) who all work together to provide you with the care you need, when you need it. ? ?We recommend signing up for the patient portal called "MyChart".  Sign up information is provided on this After Visit Summary.  MyChart is used to connect with patients for Virtual Visits (Telemedicine).  Patients are able to view lab/test results, encounter notes, upcoming appointments, etc.  Non-urgent messages can be sent to your provider as well.   ?To learn more about what you can do with MyChart, go to [email or web address].   ? ?Your next appointment:  NEXT AVAILABLE  with DR BENSIMHON  ? ? ?3 month(s) ? ?The format for your next appointment:   ?In  Person ? ?Provider:   ?You may  the following Advanced Practice Providers  Tommye Standard, PA-C ? ? ? ?Other Instructions ? ?

## 2022-01-01 LAB — CUP PACEART REMOTE DEVICE CHECK
Battery Remaining Longevity: 114 mo
Battery Remaining Percentage: 100 %
Brady Statistic RA Percent Paced: 29 %
Brady Statistic RV Percent Paced: 83 %
Date Time Interrogation Session: 20230309041100
HighPow Impedance: 60 Ohm
Implantable Lead Implant Date: 20211102
Implantable Lead Implant Date: 20211102
Implantable Lead Implant Date: 20211102
Implantable Lead Location: 753858
Implantable Lead Location: 753859
Implantable Lead Location: 753860
Implantable Lead Model: 137
Implantable Lead Model: 4671
Implantable Lead Model: 7840
Implantable Pulse Generator Implant Date: 20211102
Lead Channel Impedance Value: 485 Ohm
Lead Channel Impedance Value: 506 Ohm
Lead Channel Impedance Value: 615 Ohm
Lead Channel Pacing Threshold Amplitude: 0.7 V
Lead Channel Pacing Threshold Amplitude: 0.9 V
Lead Channel Pacing Threshold Pulse Width: 0.4 ms
Lead Channel Pacing Threshold Pulse Width: 0.4 ms
Lead Channel Setting Pacing Amplitude: 2 V
Lead Channel Setting Pacing Amplitude: 2 V
Lead Channel Setting Pacing Amplitude: 2.5 V
Lead Channel Setting Pacing Pulse Width: 0.4 ms
Lead Channel Setting Pacing Pulse Width: 1 ms
Lead Channel Setting Sensing Sensitivity: 0.5 mV
Lead Channel Setting Sensing Sensitivity: 1 mV
Pulse Gen Serial Number: 387447

## 2022-01-15 ENCOUNTER — Ambulatory Visit (INDEPENDENT_AMBULATORY_CARE_PROVIDER_SITE_OTHER)
Admission: RE | Admit: 2022-01-15 | Discharge: 2022-01-15 | Disposition: A | Discharge status: Home / Self Care | Payer: PPO | Source: Ambulatory Visit | Attending: Physician Assistant | Admitting: Physician Assistant

## 2022-01-15 ENCOUNTER — Other Ambulatory Visit: Payer: Self-pay

## 2022-01-15 DIAGNOSIS — I77810 Thoracic aortic ectasia: Secondary | ICD-10-CM | POA: Diagnosis not present

## 2022-01-15 MED ORDER — IOHEXOL 350 MG/ML SOLN
100.0000 mL | Freq: Once | INTRAVENOUS | Status: AC | PRN
  Administered 2022-01-15: 100 mL via INTRAVENOUS

## 2022-01-19 ENCOUNTER — Telehealth: Payer: Self-pay | Admitting: *Deleted

## 2022-01-19 NOTE — Telephone Encounter (Signed)
Spoke with patient aware of results and verbalized understanding.Marland Kitchen ?

## 2022-01-19 NOTE — Telephone Encounter (Signed)
-----   Message from [place], [place] sent at 01/15/2022  6:21 PM EDT ----- ?CT is stable, recommends annual CT to monitor aorta ?

## 2022-02-23 ENCOUNTER — Ambulatory Visit (INDEPENDENT_AMBULATORY_CARE_PROVIDER_SITE_OTHER): Payer: PPO

## 2022-02-23 DIAGNOSIS — I5022 Chronic systolic (congestive) heart failure: Secondary | ICD-10-CM

## 2022-02-23 DIAGNOSIS — I428 Other cardiomyopathies: Secondary | ICD-10-CM | POA: Diagnosis not present

## 2022-02-23 LAB — CUP PACEART REMOTE DEVICE CHECK
Battery Remaining Longevity: 102 mo
Battery Remaining Percentage: 100 %
Brady Statistic RA Percent Paced: 23 %
Brady Statistic RV Percent Paced: 85 %
Date Time Interrogation Session: 20230501041100
HighPow Impedance: 64 Ohm
Implantable Lead Implant Date: 20211102
Implantable Lead Implant Date: 20211102
Implantable Lead Implant Date: 20211102
Implantable Lead Location: 753858
Implantable Lead Location: 753859
Implantable Lead Location: 753860
Implantable Lead Model: 137
Implantable Lead Model: 4671
Implantable Lead Model: 7840
Implantable Pulse Generator Implant Date: 20211102
Lead Channel Impedance Value: 399 Ohm
Lead Channel Impedance Value: 474 Ohm
Lead Channel Impedance Value: 610 Ohm
Lead Channel Pacing Threshold Amplitude: 0.7 V
Lead Channel Pacing Threshold Amplitude: 1 V
Lead Channel Pacing Threshold Pulse Width: 0.4 ms
Lead Channel Pacing Threshold Pulse Width: 0.4 ms
Lead Channel Setting Pacing Amplitude: 2 V
Lead Channel Setting Pacing Amplitude: 2 V
Lead Channel Setting Pacing Amplitude: 2.5 V
Lead Channel Setting Pacing Pulse Width: 0.4 ms
Lead Channel Setting Pacing Pulse Width: 1 ms
Lead Channel Setting Sensing Sensitivity: 0.5 mV
Lead Channel Setting Sensing Sensitivity: 1 mV
Pulse Gen Serial Number: 387447

## 2022-03-04 ENCOUNTER — Other Ambulatory Visit (HOSPITAL_COMMUNITY): Payer: Self-pay | Admitting: Internal Medicine

## 2022-03-10 NOTE — Progress Notes (Signed)
Remote ICD transmission.   

## 2022-03-13 ENCOUNTER — Encounter (HOSPITAL_COMMUNITY): Payer: PPO | Admitting: Internal Medicine

## 2022-04-08 NOTE — Progress Notes (Signed)
Cardiology Office Note Date:  04/08/2022  Patient ID:  Gianfranco, Araki Mar 11, 1946, MRN [id number] PCP:  Charlotte Sanes, MD  Cardiologist:  Dr. Haroldine Laws Electrophysiologist: Dr. Lovena Le    Chief Complaint:  annual visit  History of Present Illness: ARMONI DEPASS is a 76 y.o. male with history of colon ca (tx surgically), LBBB, PVCs, NICM, chronic CHF (systolic).  He comes in today to be seen for Dr. Lovena Le, last seen by him Feb 2022.  He was doing well with class II symptoms, continued to work part time.  LV>RV delay made 20seconds earlier.  BP a but high and encouraged low salt diet.  More recently he saw Dr. Haroldine Laws March 2022.  Coreg was further titrated upwards, amiodarone reduced with mention pf switching to mexiletine.  Echo that day noted Ascending Ao 4.5cm, planned for CT  CT noted 4.1-4.2cm  Ascending AO  I saw him March 2023 He is doing great. 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 He denies any CP, palpitations or cardiac awareness No SOB, DOE No near syncope or syncope, occasionally gets a little lightheaded with standing His QRS was wider, LV leasd thresholds up some, but had good capture and pacing. Given good heart logic data and clinically doing very well, no changes were made. Planned to update his CT for his AO (was stable) and see him back in a few months to monitor his HF data and LV lead.  Was to have seen HF in may but cancelled  > August visit  TODAY He continues to do well. 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 NO CP, palpitations or cardiac awareness. Reports good exertional capacity, will get a little lightheaded when he bends over to pet the cat and comes back up. No near syncope or syncope. Admits to an occasional sluggish day here and there only that he blames on his age and is not new.  He saw  his PMD after the march labs, had full labs and physical, he has not been asked to change his synthroid dose, admits that he is bad about taking it regularly, eats breakfast at 0500.   Device information [facility] CRT-D implanted 08/27/20 (LV lead is in CS position)   AAD hx Amiodarone for PVCs started 03/2020   Past Medical History:  Diagnosis Date   CHF (congestive heart failure) ([place])     Past Surgical History:  Procedure Laterality Date   BIV ICD INSERTION CRT-D N/A 08/27/2020   Procedure: BIV ICD INSERTION CRT-D;  Surgeon: Evans Lance, MD;  Location: [facility];  Service: Cardiovascular;  Laterality: N/A;   RIGHT/LEFT HEART CATH AND CORONARY ANGIOGRAPHY N/A 12/19/2019   Procedure: RIGHT/LEFT HEART CATH AND CORONARY ANGIOGRAPHY;  Surgeon: Jolaine Artist, MD;  Location: [facility];  Service: Cardiovascular;  Laterality: N/A;    Current Outpatient Medications  Medication Sig Dispense Refill   alfuzosin (UROXATRAL) 10 MG 24 hr tablet Take 10 mg by mouth daily as needed (Fluid retention).      amiodarone (PACERONE) 200 MG tablet TAKE 1 TABLET BY MOUTH ONCE DAILY. 30 tablet 11   aspirin EC 81 MG tablet Take 81 mg by mouth daily.     carvedilol (COREG) 6.25 MG tablet TAKE 1 TABLET BY MOUTH 2 TIMES DAILY. 60 tablet 11   ENTRESTO 97-103 MG TAKE 1 TABLET  BY MOUTH TWICE(2) DAILY 60 tablet 3   fluticasone (FLONASE) 50 MCG/ACT nasal spray Place 1 spray into both nostrils daily as needed for allergies or rhinitis.     furosemide (LASIX) 40 MG tablet Take 1 tablet (40 mg total) by mouth daily as needed. 30 tablet    Multiple Vitamin (MULTIVITAMIN WITH MINERALS) TABS tablet Take 1 tablet by mouth daily.     naproxen (NAPROSYN) 500 MG tablet Take 500 mg by mouth 2 (two) times daily as needed. Pain     SYNTHROID 50 MCG tablet Take 1 tablet (50 mcg total) by mouth daily before breakfast. NEEDS OFFICE VISIT FOR MORE REFILLS 30 tablet 0   zolpidem (AMBIEN) 5 MG tablet Take 2.5 mg  by mouth at bedtime as needed for sleep.      No current facility-administered medications for this visit.    Allergies:   Patient has no known allergies.   Social History:  The patient  reports that he has been smoking. He has never used smokeless tobacco. He reports that he does not drink alcohol.   Family History:  The patient's family history is not on file.  ROS:  Please see the history of present illness.    All other systems are reviewed and otherwise negative.   PHYSICAL EXAM:  VS:  There were no vitals taken for this visit. BMI: There is no height or weight on file to calculate BMI. Well nourished, well developed, in no acute distress HEENT: normocephalic, atraumatic Neck: no JVD, carotid bruits or masses Cardiac:  RRR; no significant murmurs, no rubs, or gallops Lungs:  CTA b/l, no wheezing, rhonchi or rales Abd: soft, nontender MS: no deformity or atrophy Ext: no edema Skin: warm and dry, no rash Neuro:  No gross deficits appreciated Psych: euthymic mood, full affect  ICD site is stable, no tethering or discomfort   EKG:  Done today and reviewed by myself shows  AV pacing 50, QRS morphology same, QRS is 158bpm 12/31/21: AV paced, 50bpm, PVC, + lead I, tiny R wave V1, QRS 164  Device interrogation done today and reviewed by myself:  Battery and lead measurements are good RV/LV pacing only 87-88% No arrhythmias noted No ectopy observed today At LOC he does have a slow V rate so not sure why his pacing % is low. With industry support Not entirely clear, his intrinsic AV delay is 255m SAV/PAV delays is programmed 130 and 190 and capture is good.   01/15/22: CT chest IMPRESSION: 1. Stable mild aneurysmal disease of the ascending thoracic aorta with maximum caliber of approximately 4.2 cm. Recommend annual imaging followup by CTA or MRA. This recommendation follows 2010 ACCF/AHA/AATS/ACR/ASA/SCA/SCAI/SIR/STS/SVM Guidelines for the Diagnosis and Management of  Patients with Thoracic Aortic Disease. Circulation. 2010; 121:: I347-Q259 Aortic aneurysm NOS (ICD10-I71.9) 2. Stable aortic atherosclerosis. 3. Stable coronary atherosclerosis. 4. Stable appearance of cardiac enlargement and pacemaker.   01/15/21: CT chest IMPRESSION: 1. Mild aneurysmal disease of the ascending thoracic aorta with maximal caliber of approximately 4.1-4.2 cm. Accurate measurement of the proximal ascending thoracic aorta is difficult due to pulsation artifact. Recommend annual imaging followup by CTA or MRA. This recommendation follows 2010 ACCF/AHA/AATS/ACR/ASA/SCA/SCAI/SIR/STS/SVM Guidelines for the Diagnosis and Management of Patients with Thoracic Aortic Disease. Circulation. 2010; 121:: D638-V564 Aortic aneurysm NOS (ICD10-I71.9) 2. Coronary atherosclerosis with visualized calcified coronary artery plaque. 3. Mild cardiac enlargement.  Indwelling biventricular pacemaker.   Echo 12/30/20 as per Dr. BClayborne Danaread/note EF 55-60%  RV ok. Mild-moderate dilation of Asc  Ao at 4.5 cm    12/30/20: TTE IMPRESSIONS   1. Left ventricular ejection fraction, by estimation, is 55 to 60%. The  left ventricle has normal function. The left ventricle has no regional  wall motion abnormalities. There is mild left ventricular hypertrophy.  Left ventricular diastolic parameters  are consistent with Grade I diastolic dysfunction (impaired relaxation).   2. Right ventricular systolic function is normal. The right ventricular  size is normal. There is normal pulmonary artery systolic pressure.   3. Left atrial size was mildly dilated.   4. The mitral valve is normal in structure. Trivial mitral valve  regurgitation. No evidence of mitral stenosis.   5. The aortic valve is tricuspid. There is mild calcification of the  aortic valve. Aortic valve regurgitation is not visualized. No aortic  stenosis is present.   6. Aortic dilatation noted. There is borderline dilatation of the aortic   root, measuring 39 mm. There is moderate dilatation of the ascending  aorta, measuring 45 mm.   7. The inferior vena cava is normal in size with greater than 50%  respiratory variability, suggesting right atrial pressure of 3 mmHg.    cMRI 9/21: LVEF 29% with dyssynchrony. No LGE. RVEF 59%  Cath 2/21:  1. Mild non-obstructive CAD 2. 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 Well-compensated hemodynamics with low filling pressures   Zio 2/21: 1. NSR - min HR of 39 bpm, avg HR of 67 bpm. 2. Two runs of NSVT  - the longest lasting 8 beats with an avg rate of 105 bpm.  3. 18 runs of SVT - the longest lasting 14.2 secs with an avg rate of 99 bpm. 4. Frequent PACs (5.3%, 34831)  5, Frequent PVCs (6.2%, 72094) with two priamry morphologies (5.0% and 1.2% respectively)  6. Difficulty discerning atrial activity making definitive diagnosis difficult to ascertain.  Recent Labs: 12/31/2021: ALT 29; BUN 26; Creatinine, Ser 1.46; Potassium 5.0; Sodium 143; TSH 19.200  No results found for requested labs within last 365 days.   CrCl cannot be calculated (Patient's most recent lab result is older than the maximum 21 days allowed.).   Wt Readings from Last 3 Encounters:  12/31/21 180 lb 6.4 oz (81.8 kg)  12/30/20 170 lb 6.4 oz (77.3 kg)  12/02/20 168 lb (76.2 kg)     Other studies reviewed: Additional studies/records reviewed today include: summarized above  ASSESSMENT AND PLAN:  ICD  Intact function LV lead threshold is stable, LV outputs left at 2.0 (threshold 1.6) given his hx of diaphragmatic stim. QRS shorter today then last, Heart logic score remains zero BP though only in the 87-88% range  This is unclear, by diagnostics appears perhaps occasionally with igher rates his AV conduction improves vs PVCs Though PVC burden looks about the same by the numbers. Increased his base pacing rate to 60 given some reports of feeling sluggish some days (from 50) Increasing his coreg to  12.'5mg'$  BID And will have him wear a 3 day monitor to see what his PVC burden is vs intermittent AV conduction  Back in 4 mo   NICM Chronic CHF Recovered LVEF by his echo last year No symptoms or exam findings of volume OL HF data looks good On BB, Entresto Follows with HF team next in Aug  PVCs Amiodarone Increase his coreg as discussed above  Dilated ascending Ao Stable CT, plan/recommended annual imaging  6. High BP Increase coreg as discussed    Disposition: as above  Current medicines  are reviewed at length with the patient today.  The patient did not have any concerns regarding medicines.  Venetia Night, PA-C 04/08/2022 8:59 AM     [facility] [place] [id number] [phone] (office)  [phone] (fax)

## 2022-04-09 ENCOUNTER — Ambulatory Visit (INDEPENDENT_AMBULATORY_CARE_PROVIDER_SITE_OTHER): Payer: PPO | Admitting: Physician Assistant

## 2022-04-09 ENCOUNTER — Encounter: Payer: Self-pay | Admitting: Physician Assistant

## 2022-04-09 ENCOUNTER — Ambulatory Visit (INDEPENDENT_AMBULATORY_CARE_PROVIDER_SITE_OTHER): Payer: PPO

## 2022-04-09 VITALS — BP 154/86 | HR 50 | Ht 67.0 in | Wt 182.0 lb

## 2022-04-09 DIAGNOSIS — I428 Other cardiomyopathies: Secondary | ICD-10-CM

## 2022-04-09 DIAGNOSIS — I493 Ventricular premature depolarization: Secondary | ICD-10-CM

## 2022-04-09 DIAGNOSIS — I5022 Chronic systolic (congestive) heart failure: Secondary | ICD-10-CM

## 2022-04-09 DIAGNOSIS — I10 Essential (primary) hypertension: Secondary | ICD-10-CM

## 2022-04-09 DIAGNOSIS — Z95810 Presence of automatic (implantable) cardiac defibrillator: Secondary | ICD-10-CM

## 2022-04-09 LAB — CUP PACEART INCLINIC DEVICE CHECK
Date Time Interrogation Session: 20230615171826
Implantable Lead Implant Date: 20211102
Implantable Lead Implant Date: 20211102
Implantable Lead Implant Date: 20211102
Implantable Lead Location: 753858
Implantable Lead Location: 753859
Implantable Lead Location: 753860
Implantable Lead Model: 137
Implantable Lead Model: 4671
Implantable Lead Model: 7840
Implantable Pulse Generator Implant Date: 20211102
Lead Channel Pacing Threshold Amplitude: 1 V
Lead Channel Pacing Threshold Amplitude: 1 V
Lead Channel Pacing Threshold Amplitude: 1.6 V
Lead Channel Pacing Threshold Pulse Width: 0.4 ms
Lead Channel Pacing Threshold Pulse Width: 0.4 ms
Lead Channel Pacing Threshold Pulse Width: 1 ms
Lead Channel Sensing Intrinsic Amplitude: 18 mV
Lead Channel Sensing Intrinsic Amplitude: 3.5 mV
Pulse Gen Serial Number: 387447

## 2022-04-09 MED ORDER — CARVEDILOL 12.5 MG PO TABS
12.5000 mg | ORAL_TABLET | Freq: Two times a day (BID) | ORAL | 0 refills | Status: DC
  Filled 2022-10-23: qty 180, 90d supply, fill #0

## 2022-04-09 NOTE — Progress Notes (Unsigned)
Enrolled patient for a 3 day Zio XT monitor to be mailed to patients home  

## 2022-04-09 NOTE — Patient Instructions (Addendum)
Medication Instructions:    START TAKING:  COREG 12.5 MG TWICE A DAY   *If you need a refill on your cardiac medications before your next appointment, please call your pharmacy*   Lab Work: NONE ORDERED  TODAY   If you have labs (blood work) drawn today and your tests are completely normal, you will receive your results only by: [place] (if you have MyChart) OR A paper copy in the mail If you have any lab test that is abnormal or we need to change your treatment, we will call you to review the results.   Testing/Procedures: Your physician has recommended that you wear an event monitor. Event monitors are medical devices that record the heart's electrical activity. Doctors most often [place] these monitors to diagnose arrhythmias. Arrhythmias are problems with the speed or rhythm of the heartbeat. The monitor is a small, portable device. You can wear one while you do your normal daily activities. This is usually used to diagnose what is causing palpitations/syncope (passing out).    Follow-Up: At [facility], you and your health needs are our priority.  As part of our continuing mission to provide you with exceptional heart care, we have created designated Provider Care Teams.  These Care Teams include your primary Cardiologist (physician) and Advanced Practice Providers (APPs -  Physician Assistants and Nurse Practitioners) who all work together to provide you with the care you need, when you need it.  We recommend signing up for the patient portal called "MyChart".  Sign up information is provided on this After Visit Summary.  MyChart is used to connect with patients for Virtual Visits (Telemedicine).  Patients are able to view lab/test results, encounter notes, upcoming appointments, etc.  Non-urgent messages can be sent to your provider as well.   To learn more about what you can do with MyChart, go to [email or web address].    Your next appointment:   4 month(s)  The format  for your next appointment:   In Person  Provider:   Tommye Standard, PA-C    Other Instructions  [place] Monitor Instructions  Your physician has requested you wear a ZIO patch monitor for  3 days.  This is a single patch monitor. [employer] supplies one patch monitor per enrollment. Additional stickers are not available. Please do not apply patch if you will be having a Nuclear Stress Test,  Echocardiogram, Cardiac CT, MRI, or Chest Xray during the period you would be wearing the  monitor. The patch cannot be worn during these tests. You cannot remove and re-apply the  ZIO XT patch monitor.  Your ZIO patch monitor will be mailed 3 day USPS to your address on file. It may take 3-5 days  to receive your monitor after you have been enrolled.  Once you have received your monitor, please review the enclosed instructions. Your monitor  has already been registered assigning a specific monitor serial # to you.  Billing and Patient Assistance Program Information  We have supplied [employer] with any of your insurance information on file for billing purposes. [employer] offers a sliding scale Patient Assistance Program for patients that do not have  insurance, or whose insurance does not completely cover the cost of the ZIO monitor.  You must apply for the Patient Assistance Program to qualify for this discounted rate.  To apply, please call [employer] at [phone], select option 4, select option 2, ask to apply for  Patient Assistance Program. Theodore Demark will ask your household income,  and how many people  are in your household. They will quote your out-of-pocket cost based on that information.  [employer] will also be able to set up a 33-month interest-free payment plan if needed.  Applying the monitor   Shave hair from upper left chest.  Hold abrader disc by orange tab. Rub abrader in 40 strokes over the upper left chest as  indicated in your monitor instructions.  Clean area with 4 enclosed  alcohol pads. Let dry.  Apply patch as indicated in monitor instructions. Patch will be placed under collarbone on left  side of chest with arrow pointing upward.  Rub patch adhesive wings for 2 minutes. Remove white label marked "1". Remove the white  label marked "2". Rub patch adhesive wings for 2 additional minutes.  While looking in a mirror, press and release button in center of patch. A small green light will  flash 3-4 times. This will be your only indicator that the monitor has been turned on.  Do not shower for the first 24 hours. You may shower after the first 24 hours.  Press the button if you feel a symptom. You will hear a small click. Record Date, Time and  Symptom in the Patient Logbook.  When you are ready to remove the patch, follow instructions on the last 2 pages of Patient  Logbook. Stick patch monitor onto the last page of Patient Logbook.  Place Patient Logbook in the blue and white box. Use locking tab on box and tape box closed  securely. The blue and white box has prepaid postage on it. Please place it in the mailbox as  soon as possible. Your physician should have your test results approximately 7 days after the  monitor has been [facility]  Call IMelissaat [phone] you have questions regarding  your ZIO XT patch monitor. Call them immediately if you see an orange light blinking on your  monitor.  If your monitor falls off in less than 4 days, contact our Monitor department at [phone]  If your monitor becomes loose or falls off after 4 days call [employer] at [phone]  suggestions on securing your monitor  Important Information About [employer]

## 2022-04-10 ENCOUNTER — Other Ambulatory Visit (HOSPITAL_COMMUNITY): Payer: Self-pay

## 2022-04-12 DIAGNOSIS — I493 Ventricular premature depolarization: Secondary | ICD-10-CM | POA: Diagnosis not present

## 2022-05-15 ENCOUNTER — Other Ambulatory Visit (HOSPITAL_COMMUNITY): Payer: Self-pay

## 2022-05-15 ENCOUNTER — Telehealth (HOSPITAL_COMMUNITY): Payer: Self-pay | Admitting: Pharmacy Technician

## 2022-05-15 NOTE — Telephone Encounter (Signed)
Advanced Heart Failure Patient Advocate Encounter  Patient called in requesting another grant since his PAN grant is almost out of funds.  The patient was approved for a [employer] grant that will help cover the cost of Entresto. Total amount awarded, $10,000. Eligibility, 04/15/22 - 04/15/23.  ID [id number]  BIN [id number]  PCN [id number]  Group [id number]  [employer] required a random diagnosis verification for the patient's grant to remain active. Document uploaded to [employer]. Document scanned to chart. Emailed patient a copy of the grant information.  Charlann Boxer, CPhT

## 2022-05-25 ENCOUNTER — Ambulatory Visit (INDEPENDENT_AMBULATORY_CARE_PROVIDER_SITE_OTHER): Payer: PPO

## 2022-05-25 DIAGNOSIS — I428 Other cardiomyopathies: Secondary | ICD-10-CM | POA: Diagnosis not present

## 2022-05-25 DIAGNOSIS — I5022 Chronic systolic (congestive) heart failure: Secondary | ICD-10-CM

## 2022-05-26 LAB — CUP PACEART REMOTE DEVICE CHECK
Battery Remaining Longevity: 102 mo
Battery Remaining Percentage: 100 %
Brady Statistic RA Percent Paced: 69 %
Brady Statistic RV Percent Paced: 93 %
Date Time Interrogation Session: 20230801042200
HighPow Impedance: 67 Ohm
Implantable Lead Implant Date: 20211102
Implantable Lead Implant Date: 20211102
Implantable Lead Implant Date: 20211102
Implantable Lead Location: 753858
Implantable Lead Location: 753859
Implantable Lead Location: 753860
Implantable Lead Model: 137
Implantable Lead Model: 4671
Implantable Lead Model: 7840
Implantable Pulse Generator Implant Date: 20211102
Lead Channel Impedance Value: 391 Ohm
Lead Channel Impedance Value: 504 Ohm
Lead Channel Impedance Value: 738 Ohm
Lead Channel Pacing Threshold Amplitude: 0.8 V
Lead Channel Pacing Threshold Amplitude: 0.9 V
Lead Channel Pacing Threshold Pulse Width: 0.4 ms
Lead Channel Pacing Threshold Pulse Width: 0.4 ms
Lead Channel Setting Pacing Amplitude: 2 V
Lead Channel Setting Pacing Amplitude: 2 V
Lead Channel Setting Pacing Amplitude: 2.5 V
Lead Channel Setting Pacing Pulse Width: 0.4 ms
Lead Channel Setting Pacing Pulse Width: 1 ms
Lead Channel Setting Sensing Sensitivity: 0.5 mV
Lead Channel Setting Sensing Sensitivity: 1 mV
Pulse Gen Serial Number: 387447

## 2022-06-01 ENCOUNTER — Telehealth: Payer: Self-pay

## 2022-06-01 NOTE — Telephone Encounter (Signed)
Device alert Cardiac Resynchronization Therapy pacing of < 85%. Pacing was 84% between May 31, 2022 04:09 and Jun 01, 2022 04:19. Trend 91% HL=3 Presenting rhythm AP/BiV pace, appears to be loss of LV capture, route to triage.  Spoke to [employer] with [employer], advised patient to come in today for check. Called patient to offer in-clinic apt today, patient stated he is not able to due to work and out of town until 06/24/22. Explained to patient his wire is not working properly which could cause him to feel poorly. Patient voiced understanding and still not able to come in earlier for apt. Patient offered DC apt 06/24/22 @ 8:40 am, states that is to far out for him to plan but he will tr to make it. Advised patient if he needs to change to please call and let us know. Patient voiced understanding. Will need industry present for assistance.

## 2022-06-02 ENCOUNTER — Telehealth: Payer: Self-pay

## 2022-06-02 NOTE — Telephone Encounter (Signed)
Pt called stating he can come earlier than 8/30 for his appointment. He agreed to come 8/9 at 9:20 am

## 2022-06-03 ENCOUNTER — Ambulatory Visit (INDEPENDENT_AMBULATORY_CARE_PROVIDER_SITE_OTHER): Payer: PPO

## 2022-06-03 DIAGNOSIS — I428 Other cardiomyopathies: Secondary | ICD-10-CM | POA: Diagnosis not present

## 2022-06-03 DIAGNOSIS — I447 Left bundle-branch block, unspecified: Secondary | ICD-10-CM

## 2022-06-03 LAB — CUP PACEART INCLINIC DEVICE CHECK
Date Time Interrogation Session: 20230809093243
HighPow Impedance: 62 Ohm
Implantable Lead Implant Date: 20211102
Implantable Lead Implant Date: 20211102
Implantable Lead Implant Date: 20211102
Implantable Lead Location: 753858
Implantable Lead Location: 753859
Implantable Lead Location: 753860
Implantable Lead Model: 137
Implantable Lead Model: 4671
Implantable Lead Model: 7840
Implantable Pulse Generator Implant Date: 20211102
Lead Channel Impedance Value: 397 Ohm
Lead Channel Impedance Value: 485 Ohm
Lead Channel Impedance Value: 678 Ohm
Lead Channel Pacing Threshold Amplitude: 0.9 V
Lead Channel Pacing Threshold Amplitude: 1 V
Lead Channel Pacing Threshold Amplitude: 1.4 V
Lead Channel Pacing Threshold Pulse Width: 0.4 ms
Lead Channel Pacing Threshold Pulse Width: 0.4 ms
Lead Channel Pacing Threshold Pulse Width: 1 ms
Lead Channel Sensing Intrinsic Amplitude: 15 mV
Lead Channel Sensing Intrinsic Amplitude: 2.5 mV
Lead Channel Sensing Intrinsic Amplitude: 7.7 mV
Lead Channel Setting Pacing Amplitude: 2 V
Lead Channel Setting Pacing Amplitude: 2 V
Lead Channel Setting Pacing Amplitude: 3 V
Lead Channel Setting Pacing Pulse Width: 0.4 ms
Lead Channel Setting Pacing Pulse Width: 1 ms
Lead Channel Setting Sensing Sensitivity: 0.5 mV
Lead Channel Setting Sensing Sensitivity: 1 mV
Pulse Gen Serial Number: 387447

## 2022-06-03 NOTE — Progress Notes (Signed)
CRT-D device check in office for intermittent loss of LV capture on remote transmission from 06/01/22.  In clinic check LV threshold 1.6 mv to 1.7 mv @ 1 ms.  Spoke with Joey BS rep.  LV output programmed to 3.0 mv at 1.0 ms.  Other vectors thresholds greater than 1.5 mv @ 1.0 ms.  RV threshold 0.9 mv at 0.4 ms.  RV output lowered from 2.5 mv at 0.4 ms to 2.0 mv at 0.4 ms.  Battery longevity 8 years.  Pt has office visit scheduled with RU on 07/28/2022.

## 2022-06-03 NOTE — Patient Instructions (Signed)
Follow up as scheduled.  

## 2022-06-10 ENCOUNTER — Other Ambulatory Visit (HOSPITAL_COMMUNITY): Payer: Self-pay

## 2022-06-10 MED ORDER — SYNTHROID 50 MCG PO TABS: 50.0000 ug | ORAL_TABLET | Freq: Every day | ORAL | 0 refills | Status: DC

## 2022-06-15 ENCOUNTER — Telehealth (HOSPITAL_COMMUNITY): Payer: Self-pay | Admitting: Vascular Surgery

## 2022-06-15 NOTE — Telephone Encounter (Signed)
Pt phone number are not in service

## 2022-06-16 ENCOUNTER — Other Ambulatory Visit (HOSPITAL_COMMUNITY): Payer: Self-pay | Admitting: *Deleted

## 2022-06-16 ENCOUNTER — Other Ambulatory Visit (HOSPITAL_COMMUNITY): Payer: Self-pay

## 2022-06-16 MED ORDER — ENTRESTO 97-103 MG PO TABS
ORAL_TABLET | ORAL | 3 refills | Status: DC
  Filled 2022-06-16 (×2): qty 180, 90d supply, fill #0
  Filled 2022-09-10: qty 180, 90d supply, fill #1
  Filled 2022-12-09: qty 180, 90d supply, fill #2
  Filled 2023-03-08: qty 180, 90d supply, fill #3

## 2022-06-17 ENCOUNTER — Other Ambulatory Visit (HOSPITAL_COMMUNITY): Payer: Self-pay

## 2022-06-22 ENCOUNTER — Encounter (HOSPITAL_COMMUNITY): Payer: PPO | Admitting: Internal Medicine

## 2022-06-25 IMAGING — CT CT ANGIO CHEST
3 of 8 series · 17 of 46 positions shown · IV contrast (OMNIPAQUE 350)
Comparison: Prior CTA of the chest on 01/15/2021

CLINICAL DATA: Follow-up mild aneurysmal dilatation of the
ascending thoracic aorta previously measuring approximately 4.1-4.2
cm by CTA.

EXAM:
CT ANGIOGRAPHY CHEST WITH CONTRAST
TECHNIQUE: Multidetector CT imaging of the chest was performed using the
standard protocol during bolus administration of intravenous
contrast. Multiplanar CT image reconstructions and MIPs were
obtained to evaluate the vascular anatomy.

[Series 4: aorta 3.0 bf37 2 · axial · 0.78mm/px · z∈[-312,-60]mm · 13 of 100 slices shown]
[im 8/100  lung]
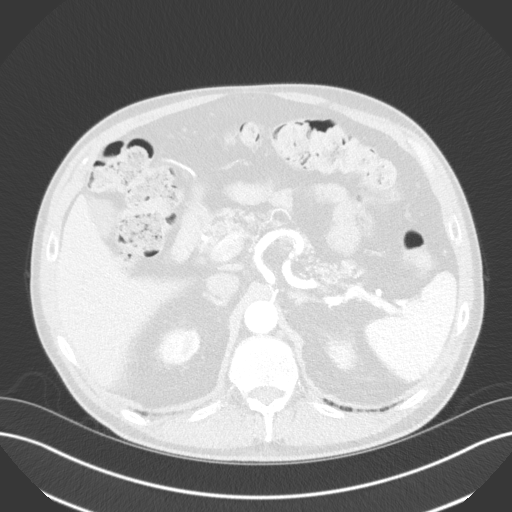
[im 15/100  soft-tissue]
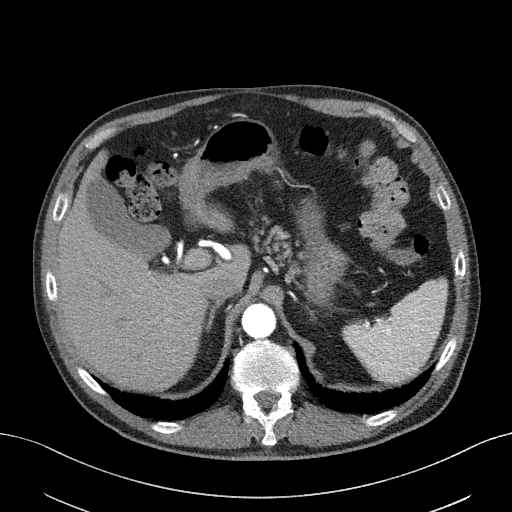
[im 22/100  lung]
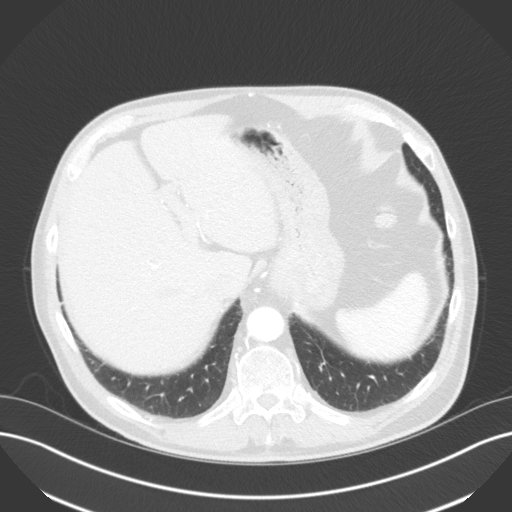
[im 29/100  soft-tissue]
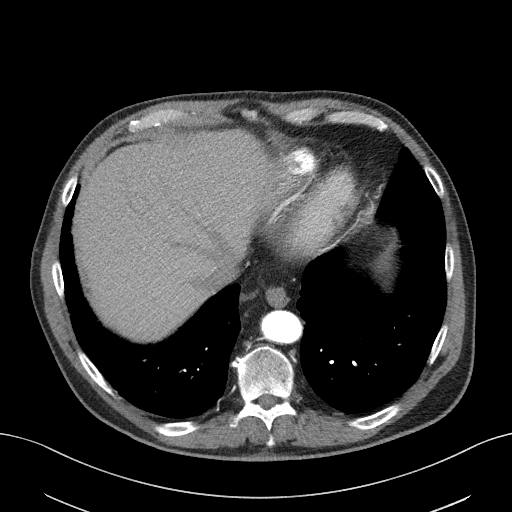
[im 36/100  lung]
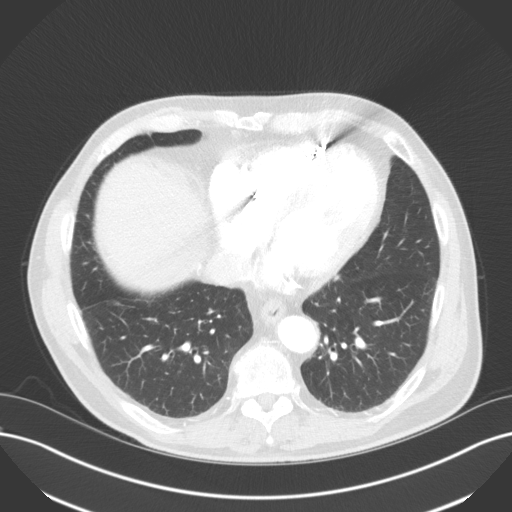
[im 43/100  soft-tissue]
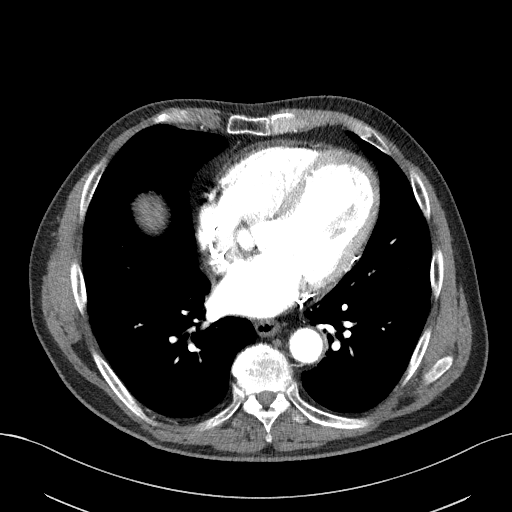
[im 50/100  lung]
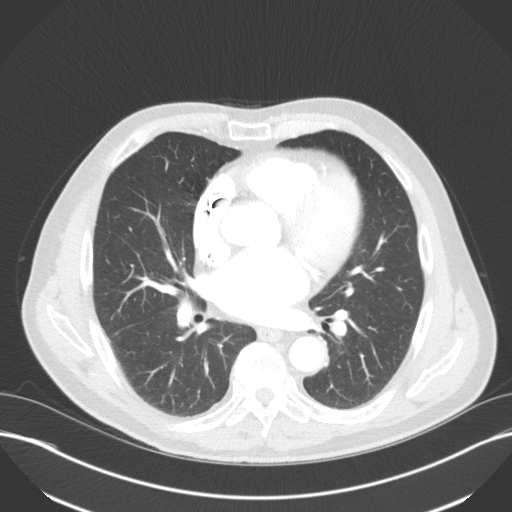
[im 57/100  soft-tissue]
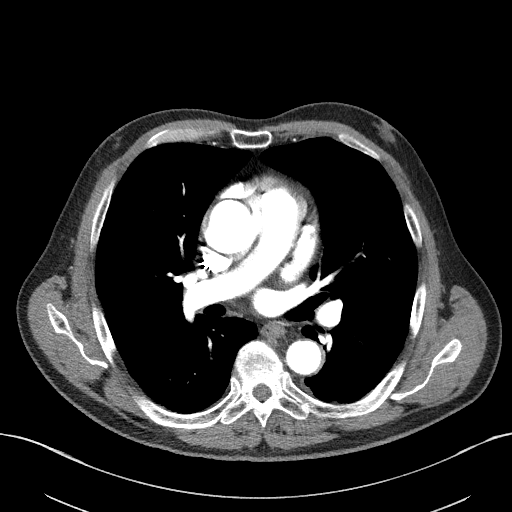
[im 64/100  lung]
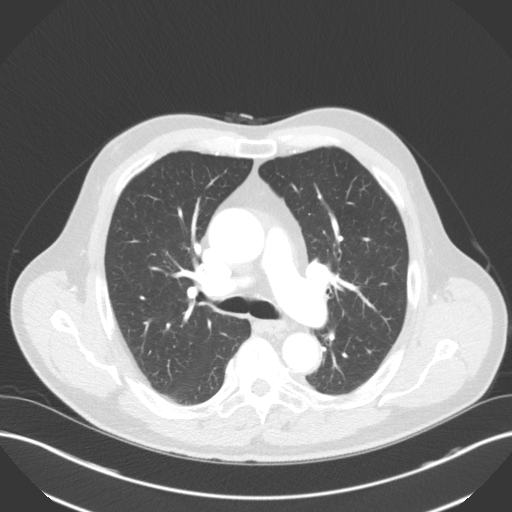
[im 71/100  soft-tissue]
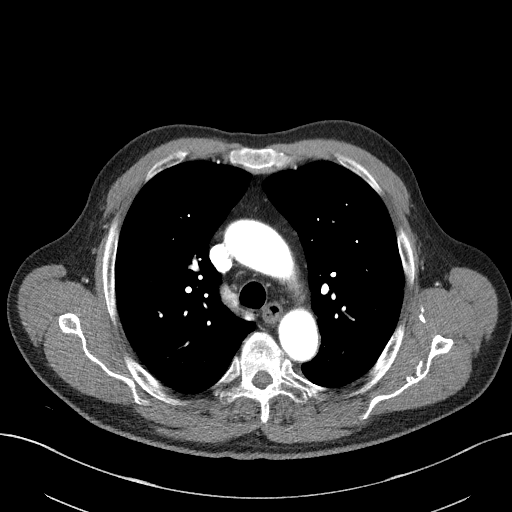
[im 78/100  lung]
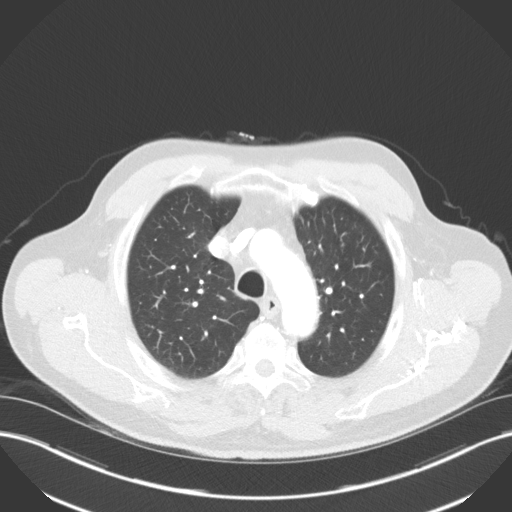
[im 85/100  soft-tissue]
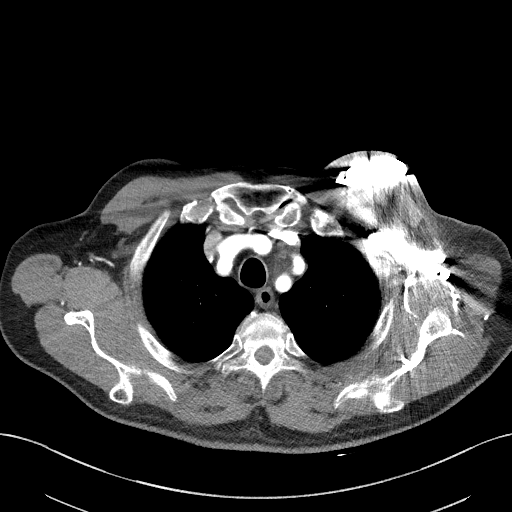
[im 92/100  lung]
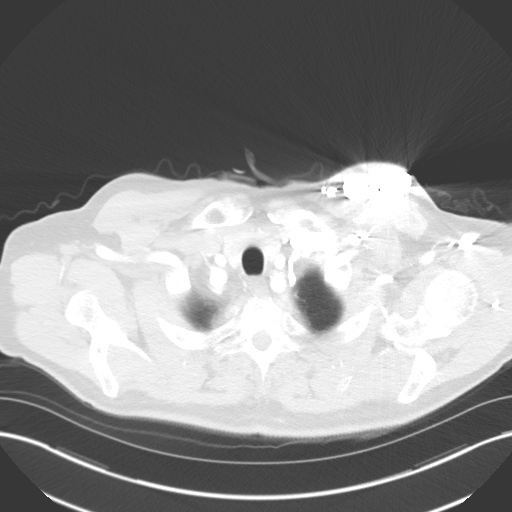

[Series 5: lung · axial · 0.78mm/px · 1 of 100 slices shown]
[im 8/100  soft-tissue]
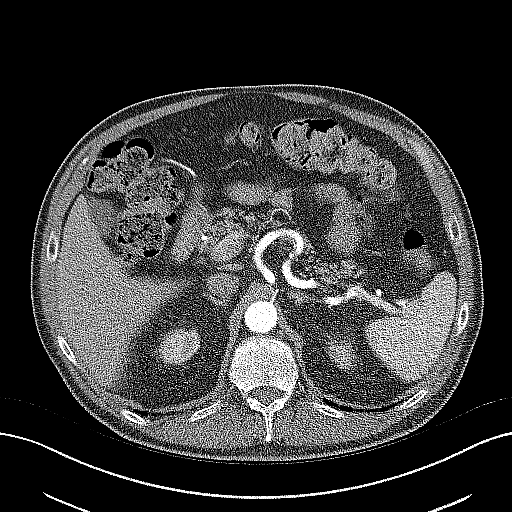

[Series 7: coronals · coronal · 0.60mm/px · 3 of 138 slices shown]
[im 35/138  soft-tissue]
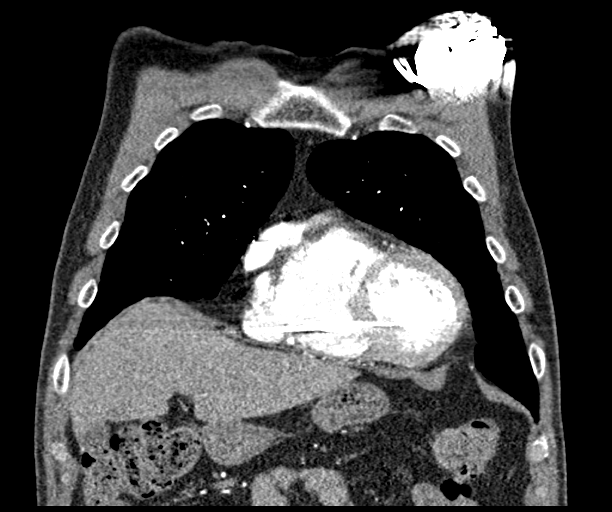
[im 69/138  soft-tissue]
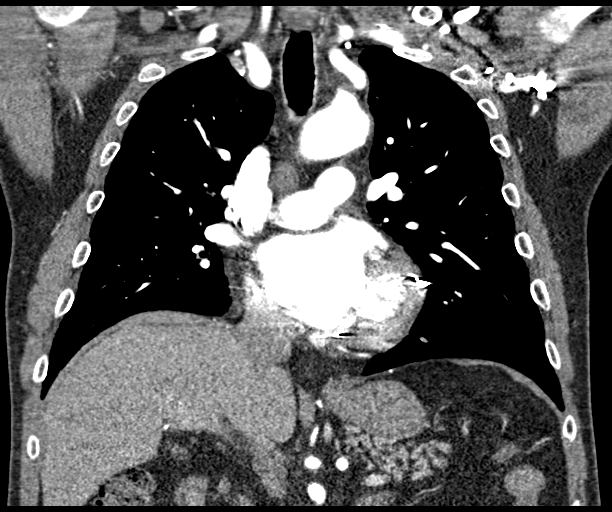
[im 103/138  soft-tissue]
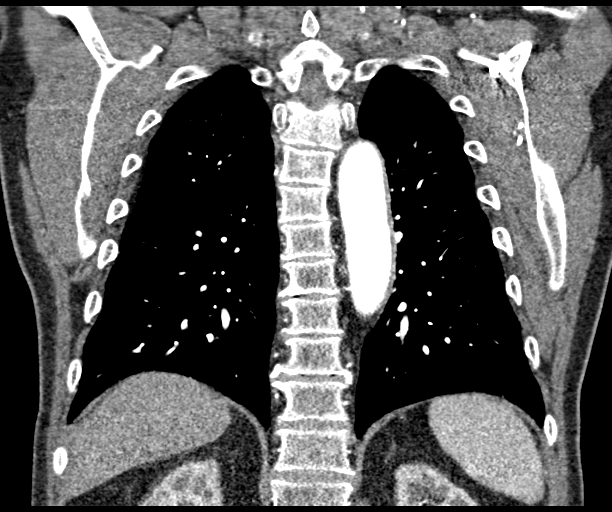

[17 of 46 positions shown; findings below may reference images not displayed]

RADIATION DOSE REDUCTION: This exam was performed according to the
departmental dose-optimization program which includes automated
exposure control, adjustment of the mA and/or kV according to
patient size and/or use of iterative reconstruction technique.

CONTRAST:  100mL OMNIPAQUE IOHEXOL 350 MG/ML SOLN
FINDINGS: Cardiovascular: Aortic dimensions are stable since the prior study.
The aortic root measures approximately 3.7-3.8 cm at the level of
the sinuses of Valsalva. The ascending thoracic aorta measures
approximately 4.2 cm in greatest diameter. The proximal arch
measures 3.3 cm and the distal arch 3 cm. The descending thoracic
aorta measures 2.8 cm. There is no evidence of aortic dissection.
Stable mild calcified plaque at the level of the arch and descending
thoracic aorta. Visualized proximal great vessels demonstrate normal
patency and bovine branching anatomy.

Stable cardiac enlargement and appearance of a dual-chamber
pacemaker. No pericardial fluid identified. A mild amount of
calcified coronary artery plaque is again visualized.

Mediastinum/Nodes: No enlarged mediastinal, hilar, or axillary lymph
nodes. Thyroid gland, trachea, and esophagus demonstrate no
significant findings.

Lungs/Pleura: There is no evidence of pulmonary edema,
consolidation, pneumothorax, nodule or pleural fluid.

Upper Abdomen: No acute abnormality.

Musculoskeletal: No chest wall abnormality. No acute or significant
osseous findings.

Review of the MIP images confirms the above findings.
IMPRESSION: 1. Stable mild aneurysmal disease of the ascending thoracic aorta
with maximum caliber of approximately 4.2 cm. Recommend annual
imaging followup by CTA or MRA. This recommendation follows 4787
ACCF/AHA/AATS/ACR/ASA/SCA/USUAY/BONIFATIUS/WILLY PROSPER/NOMASIBULELE Guidelines for the
Diagnosis and Management of Patients with Thoracic Aortic Disease.
Circulation. 4787; 121: E266-e369. Aortic aneurysm NOS (2F25Y-ION.K)
2. Stable aortic atherosclerosis.
3. Stable coronary atherosclerosis.
4. Stable appearance of cardiac enlargement and pacemaker.

Aortic aneurysm NOS (2F25Y-ION.K).

## 2022-06-27 NOTE — Progress Notes (Signed)
Remote ICD transmission.   

## 2022-07-01 ENCOUNTER — Encounter (HOSPITAL_COMMUNITY): Payer: Self-pay

## 2022-07-01 ENCOUNTER — Ambulatory Visit (HOSPITAL_COMMUNITY)
Admission: RE | Admit: 2022-07-01 | Discharge: 2022-07-01 | Disposition: A | Discharge status: Home / Self Care | Payer: PPO | Source: Ambulatory Visit | Attending: Family Medicine | Admitting: Family Medicine

## 2022-07-01 VITALS — BP 142/88 | HR 57 | Ht 67.0 in | Wt 176.4 lb

## 2022-07-01 DIAGNOSIS — I2510 Atherosclerotic heart disease of native coronary artery without angina pectoris: Secondary | ICD-10-CM | POA: Insufficient documentation

## 2022-07-01 DIAGNOSIS — I428 Other cardiomyopathies: Secondary | ICD-10-CM | POA: Diagnosis not present

## 2022-07-01 DIAGNOSIS — I5022 Chronic systolic (congestive) heart failure: Secondary | ICD-10-CM

## 2022-07-01 DIAGNOSIS — Z79899 Other long term (current) drug therapy: Secondary | ICD-10-CM | POA: Insufficient documentation

## 2022-07-01 DIAGNOSIS — I493 Ventricular premature depolarization: Secondary | ICD-10-CM

## 2022-07-01 DIAGNOSIS — Z85038 Personal history of other malignant neoplasm of large intestine: Secondary | ICD-10-CM | POA: Diagnosis not present

## 2022-07-01 DIAGNOSIS — I447 Left bundle-branch block, unspecified: Secondary | ICD-10-CM | POA: Diagnosis not present

## 2022-07-01 DIAGNOSIS — C189 Malignant neoplasm of colon, unspecified: Secondary | ICD-10-CM | POA: Insufficient documentation

## 2022-07-01 DIAGNOSIS — Z95810 Presence of automatic (implantable) cardiac defibrillator: Secondary | ICD-10-CM

## 2022-07-01 DIAGNOSIS — I77810 Thoracic aortic ectasia: Secondary | ICD-10-CM

## 2022-07-01 LAB — TSH: TSH: 15.319 u[IU]/mL — ABNORMAL HIGH (ref 0.350–4.500)

## 2022-07-01 MED ORDER — SPIRONOLACTONE 25 MG PO TABS
12.5000 mg | ORAL_TABLET | Freq: Every day | ORAL | 3 refills | Status: DC
  Filled 2022-12-09: qty 45, 90d supply, fill #0

## 2022-07-01 NOTE — Progress Notes (Signed)
Advanced Heart Failure Clinic Note   Primary Care: Charlotte Sanes, MD EP: Dr. Lovena Le HF Cardiologist: Dr. Haroldine Laws  HPI: [name] is a 76 y.o. with h/o colon cancer (resected 12/20. No chemo).  Admitted to [facility] 4/20 with acute HF. Echo showed EF 20%. Also found to have LBBB.    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 EF did not improve with PVC suppression and felt to be more likely LBBB CM.   cMRI 9/21: LVEF 29% with dyssynchrony. No LGE. RVEF 59%   Underwent BosSci CRT-D implant with Dr. Lovena Le on 08/27/20   Echo 12/30/20 EF 55-60%  RV ok. Mild-moderate dilation of Asc Ao at 4.5 cm    Last seen 3/22. Annual EP follow up 6/23, 3 day monitor applied to see evaluate PVC burden. Coreg increased to 12.5 bid.   Today he returns for HF follow up. Overall feeling fine. Occasional SOB bending over but otherwise no issues. Works at a [employer], has a physically active job. Denies palpitations, CP, dizziness, edema, or PND/Orthopnea. Appetite ok. No fever or chills. Weight at home 175 pounds. Taking all medications. Has not needed Lasix.   Cardiac Studies:  - Zio 3 day (6/23): Mostly NSR w/ AV pacing, occasional PVCs, rare PACs, brief NSVT  - Echo 3/22 EF 55-60%, RV ok, mild to moderate dilation of Asc Ao 4.5 cm   - Cath 2/21:  1. Mild non-obstructive CAD 2. 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 Well-compensated hemodynamics with low filling pressures   - Zio 2/21: 1. NSR - min HR of 39 bpm, avg HR of 67 bpm. 2. Two runs of NSVT  - the longest lasting 8 beats with an avg rate of 105 bpm.  3. 18 runs of SVT - the longest lasting 14.2 secs with an avg rate of 99 bpm. 4. Frequent PACs (5.3%, 34831)  5, Frequent PVCs (6.2%, 76160) with two priamry morphologies (5.0% and 1.2% respectively)  6. Difficulty discerning atrial activity making definitive diagnosis difficult to  ascertain.   ROS: All systems reviewed and negative except as per HPI.   Past Medical History:  Diagnosis Date   CHF (congestive heart failure) (HCC)    Current Outpatient Medications  Medication Sig Dispense Refill   amiodarone (PACERONE) 200 MG tablet TAKE 1 TABLET BY MOUTH ONCE DAILY. 30 tablet 11   aspirin EC 81 MG tablet Take 81 mg by mouth daily.     carvedilol (COREG) 12.5 MG tablet Take 1 tablet (12.5 mg total) by mouth 2 (two) times daily. 180 tablet 2   fluticasone (FLONASE) 50 MCG/ACT nasal spray Place 1 spray into both nostrils daily as needed for allergies or rhinitis.     Multiple Vitamin (MULTIVITAMIN WITH MINERALS) TABS tablet Take 1 tablet by mouth daily.     sacubitril-valsartan (ENTRESTO) 97-103 MG TAKE 1 TABLET BY MOUTH TWICE DAILY 180 tablet 3   SYNTHROID 50 MCG tablet Take 1 tablet (50 mcg total) by mouth daily before breakfast. NEEDS to keep appointments for further refills 30 tablet 0   zolpidem (AMBIEN) 5 MG tablet Take 2.5 mg by mouth at bedtime as needed for sleep.      alfuzosin (UROXATRAL) 10 MG 24 hr tablet Take 10 mg by mouth daily as needed (Fluid retention).      furosemide (LASIX) 40 MG tablet Take 1 tablet (40 mg total) by mouth daily as needed. (Patient not  taking: Reported on 07/01/2022) 30 tablet    naproxen (NAPROSYN) 500 MG tablet Take 500 mg by mouth 2 (two) times daily as needed. Pain (Patient not taking: Reported on 07/01/2022)     No current facility-administered medications for this encounter.   No Known Allergies  Social History   Socioeconomic History   Marital status: Single    Spouse name: Not on file   Number of children: Not on file   Years of education: Not on file   Highest education level: Not on file  Occupational History   Not on file  Tobacco Use   Smoking status: Light Smoker   Smokeless tobacco: Never   Tobacco comments:    social smoker  Substance and Sexual Activity   Alcohol use: Never   Drug use: Not on file    Sexual activity: Not on file  Other Topics Concern   Not on file  Social History Narrative   Not on file   Social Determinants of Health   Financial Resource Strain: Not on file  Food Insecurity: No Food Insecurity (07/01/2022)   Hunger Vital Sign    Worried About Running Out of Food in the Last Year: Never true    Ran Out of Food in the Last Year: Never true  Transportation Needs: No Transportation Needs (07/01/2022)   PRAPARE - Hydrologist (Medical): No    Lack of Transportation (Non-Medical): No  Physical Activity: Not on file  Stress: Not on file  Social Connections: Not on file  Intimate Partner Violence: Not on file    No family history on file. Family History:  Mom - CAD s/p CABG deceased Dad - died from SCD (heavy smoker and drinker) Sister with PPM Younger sister is disabled  BP (!) 142/88   Pulse (!) 57   Ht '5\' 7"'$  (1.702 m)   Wt 80 kg (176 lb 6.4 oz)   SpO2 97%   BMI 27.63 kg/m   Wt Readings from Last 3 Encounters:  07/01/22 80 kg (176 lb 6.4 oz)  04/09/22 82.6 kg (182 lb)  12/31/21 81.8 kg (180 lb 6.4 oz)   PHYSICAL EXAM: General:  NAD. No resp difficulty HEENT: Normal Neck: Supple. No JVD. Carotids 2+ bilat; no bruits. No lymphadenopathy or thryomegaly appreciated. Cor: PMI nondisplaced. Regular rate & rhythm. No rubs, gallops or murmurs. Lungs: Clear Abdomen: Soft, nontender, nondistended. No hepatosplenomegaly. No bruits or masses. Good bowel sounds. Extremities: No cyanosis, clubbing, rash, edema Neuro: Alert & oriented x 3, cranial nerves grossly intact. Moves all 4 extremities w/o difficulty. Affect pleasant.  BoSCI CRT-D: HL score 3. No VT/AF. 81% LV pacing  Activity level 1.8 h/day Personally reviewed  ASSESSMENT & PLAN: 1.  Chronic systolic HF due to NICM (suspect LBBB) - new diagnosis 4/20 - Cath 4/20. Minimal CAD. EF 20-25% - Etiology unclear. Possibly LBBB-CM (was told his ECG was a bit abnormal prior to colon  cancer surgery) vs frequent PVCs - PVCs suppressed with amio but EF still 25% with marked dyssynchrony (06/25/20) -> suspect LBBB CM - Echo 8/21 EF 25% - cMRI 9/21: LVEF 29% with dyssynchrony. No LGE. RVEF 59% - s/p BosSci CRT-D implant with Dr. Lovena Le on 08/27/20 - Echo (12/30/20): EF 55-60%, RV ok. Mild-moderate dilation of Asc Ao at 4.5 cm  - NYHA I. Volume status looks good. - Restart spiro 12.5 mg daily.   - Continue Entresto 97/103 mg bid. - Continue Coreg 12.5 mg bid. - Continue Lasix  PRN only  - Repeat echo to ensure EF stability. - Labs today, repeat in 1 week (given Rx for [facility])   2. Colon CA - s/p resection  - Stable in remission - Refused chemo   3. Frequent PVCs - Quiescent - 3-day Zio (6/23) looks OK.  - Suspect LBBB caused NICM and probably not PVCs.  - Continue amio 200 mg daily, per EP. Consider switch to mexilitene soon - CMET and TSH today.   4. Asc Ao dilation - Echo 12/30/20 EF 55-60%  RV ok. Mild-moderate dilation of Asc Ao at 4.5 cm  - CTA chest stable 4.2 cm (3/23). Needs annual surveillance.  Follow up in 4 months with Dr. Shelbie Proctor + echo.   Signed, Rafael Bihari, FNP  06/30/2022 4:13 PM   Advanced Heart Failure [place] [address] Heart and [place] [address] [phone] (office) [phone] (fax)

## 2022-07-01 NOTE — Patient Instructions (Addendum)
Start Spironolactone 12.5 mg daily. Get lab (BMP) in one week. Rx given for local [facility] per patient request. Your physician wants you to follow-up in: 4 months with Dr. Haroldine Laws with echo. You will receive a reminder letter in the mail two months in advance. If you don't receive a letter, please call our office to schedule the follow-up appointment at [phone].

## 2022-07-06 ENCOUNTER — Telehealth: Payer: Self-pay

## 2022-07-06 NOTE — Telephone Encounter (Signed)
CV Remote Solution Alert~ Device alert Cardiac Resynchronization Therapy pacing of < 85%. Pacing was 81% between Jul 03, 2022 10:38 and Jul 04, 2022 04:41.  Trending lower, PVC's noted on presenting HL=0 Route to triage, BiV pacing trending lower since the end of July.  Routing to [employer]. Patient has OV scheduled 07/28/22 for f/u.

## 2022-07-09 ENCOUNTER — Encounter: Payer: Self-pay | Admitting: Cardiology

## 2022-07-20 ENCOUNTER — Other Ambulatory Visit (HOSPITAL_COMMUNITY): Payer: Self-pay | Admitting: Family Medicine

## 2022-07-21 LAB — BASIC METABOLIC PANEL
BUN/Creatinine Ratio: 15 (ref 10–24)
BUN: 24 mg/dL (ref 8–27)
CO2: 20 mmol/L (ref 20–29)
Calcium: 9.6 mg/dL (ref 8.6–10.2)
Chloride: 109 mmol/L — ABNORMAL HIGH (ref 96–106)
Creatinine, Ser: 1.55 mg/dL — ABNORMAL HIGH (ref 0.76–1.27)
Glucose: 92 mg/dL (ref 70–99)
Potassium: 4.9 mmol/L (ref 3.5–5.2)
Sodium: 142 mmol/L (ref 134–144)
eGFR: 46 mL/min/{1.73_m2} — ABNORMAL LOW (ref >59)

## 2022-07-26 NOTE — Progress Notes (Unsigned)
Cardiology Office Note Date:  07/28/2022  Patient ID:  Bryce, Cruz [birth year], MRN [id number] PCP:  Charlotte Sanes, MD  Cardiologist:  Dr. Haroldine Laws Electrophysiologist: Dr. Lovena Le    Chief Complaint:  annual visit  History of Present Illness: Bryce Cruz is a 76 y.o. male with history of colon ca (tx surgically), LBBB, PVCs, NICM, chronic CHF (systolic).  He comes in today to be seen for Dr. Lovena Le, last seen by him Feb 2022.  He was doing well with class II symptoms, continued to work part time.  LV>RV delay made 20seconds earlier.  BP a but high and encouraged low salt diet.  More recently he saw Dr. Haroldine Laws March 2022.  Coreg was further titrated upwards, amiodarone reduced with mention pf switching to mexiletine.  Echo that day noted Ascending Ao 4.5cm, planned for CT  CT noted 4.1-4.2cm  Ascending AO  I saw him March 2023 He is doing great. 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 He denies any CP, palpitations or cardiac awareness No SOB, DOE No near syncope or syncope, occasionally gets a little lightheaded with standing His QRS was wider, LV leasd thresholds up some, but had good capture and pacing. Given good heart logic data and clinically doing very well, no changes were made. Planned to update his CT for his AO (was stable) and see him back in a few months to monitor his HF data and LV lead.  Was to have seen HF in may but cancelled  > August visit  I saw him June 2023 He continues to do well. 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 NO CP, palpitations or cardiac awareness. Reports good exertional capacity, will get a little lightheaded when he bends over to pet the cat and comes back up. No near syncope or syncope. Admits to an occasional sluggish day here and there only that he blames on his age and is not  new. He saw his PMD after the march labs, had full labs and physical, he has not been asked to change his synthroid dose, admits that he is bad about taking it regularly, eats breakfast at 0500.  BP% only 87-88% Underlying intrinsic rates were slow, unclear why his BP% low outside of PVCs, on this visit intrinsic AV was 215m and SAV/PAV delays were programmed shorter His coreg increased. LV threshold known to be on the higher side, still kept with better then 1/4V not more with known hx of diaphragmatic stim here Planned for 3 day monitor to evaluate PVC burden  1. NSR with mostly AV pacing 2. occasional PVC's (4.7%) 3. Brief NSVT 4. Rare PAC's  No further changes made  Saw HF team, 07/01/22, occ bendopnea, no need for prn lasix, working physical job Spironolactone restarted, planned for labs and updated echo at his next visit Amio labs done  TSH was high but lower then prior with recs to f/u with PMD.  TODAY He feels "Great", no longer gets dizzy when he stand up from bending over, denies any kind of SOB Continues to work a couple jobs, "semi-retired" from a few things, denies any DOE to me at all. No CP, palpitations or cardiac awareness. No near syncope or syncope.   [facility] 08/27/20 (LV lead is in CS position)   AAD hx Amiodarone for PVCs started 03/2020  Past Medical History:  Diagnosis Date   CHF (congestive heart failure) ([place])     Past Surgical History:  Procedure Laterality Date   BIV ICD INSERTION CRT-D N/A 08/27/2020   Procedure: BIV ICD INSERTION CRT-D;  Surgeon: Evans Lance, MD;  Location: [place] [facility];  Service: Cardiovascular;  Laterality: N/A;   RIGHT/LEFT HEART CATH AND CORONARY ANGIOGRAPHY N/A 12/19/2019   Procedure: RIGHT/LEFT HEART CATH AND CORONARY ANGIOGRAPHY;  Surgeon: Jolaine Artist, MD;  Location: [place] [facility];  Service: Cardiovascular;  Laterality: N/A;    Current Outpatient Medications   Medication Sig Dispense Refill   alfuzosin (UROXATRAL) 10 MG 24 hr tablet Take 10 mg by mouth daily as needed (Fluid retention).      amiodarone (PACERONE) 200 MG tablet TAKE 1 TABLET BY MOUTH ONCE DAILY. 30 tablet 11   aspirin EC 81 MG tablet Take 81 mg by mouth daily.     carvedilol (COREG) 12.5 MG tablet Take 1 tablet (12.5 mg total) by mouth 2 (two) times daily. 180 tablet 2   cetirizine (ZYRTEC ALLERGY) 10 MG tablet Take 1 tablet every day by oral route as needed.     Cholecalciferol 25 MCG (1000 UT) tablet Take 25 mcg by mouth daily.     fluticasone (FLONASE) 50 MCG/ACT nasal spray Place 1 spray into both nostrils daily as needed for allergies or rhinitis.     furosemide (LASIX) 40 MG tablet Take 1 tablet (40 mg total) by mouth daily as needed. 30 tablet    Multiple Vitamin (MULTIVITAMIN WITH MINERALS) TABS tablet Take 1 tablet by mouth daily.     naproxen (NAPROSYN) 500 MG tablet Take 500 mg by mouth 2 (two) times daily as needed. Pain     sacubitril-valsartan (ENTRESTO) 97-103 MG TAKE 1 TABLET BY MOUTH TWICE DAILY 180 tablet 3   spironolactone (ALDACTONE) 25 MG tablet Take 0.5 tablets (12.5 mg total) by mouth daily. 45 tablet 3   SYNTHROID 50 MCG tablet Take 1 tablet (50 mcg total) by mouth daily before breakfast. NEEDS to keep appointments for further refills 30 tablet 0   zolpidem (AMBIEN) 5 MG tablet Take 2.5 mg by mouth at bedtime as needed for sleep.      nystatin cream (MYCOSTATIN) APPLY TO THE AFFECTED AREA(S) BY TOPICAL ROUTE 2 TIMES PER DAY     No current facility-administered medications for this visit.    Allergies:   Patient has no known allergies.   Social History:  The patient  reports that he has been smoking. He has never used smokeless tobacco. He reports that he does not drink alcohol.   Family History:  The patient's family history is not on file.  ROS:  Please see the history of present illness.    All other systems are reviewed and otherwise negative.    PHYSICAL EXAM:  VS:  BP 132/68   Pulse 73   Ht '5\' 7"'$  (1.702 m)   Wt 179 lb (81.2 kg)   SpO2 96%   BMI 28.04 kg/m  BMI: Body mass index is 28.04 kg/m. Well nourished, well developed, in no acute distress HEENT: normocephalic, atraumatic Neck: no JVD, carotid bruits or masses Cardiac:   RRR; no significant murmurs, no rubs, or gallops Lungs:   CTA b/l, no wheezing, rhonchi or rales Abd: soft, nontender MS: no deformity or atrophy Ext: trace edema Skin: warm and dry, no rash Neuro:  No gross deficits appreciated Psych: euthymic mood, full affect  ICD site is  stable, no tethering or discomfort   EKG:  not done today  Device interrogation done today and reviewed by myself:  Battery and lead measurements are ok No arrhythmias BP 87% Measured intrinic PR is 332-315m, and his PAV/SAV well short of that.  June 2023, monitor 1. NSR with mostly AV pacing 2. occasional PVC's 3. Brief NSVT 4. Rare PAC's   01/15/22: CT chest IMPRESSION: 1. Stable mild aneurysmal disease of the ascending thoracic aorta with maximum caliber of approximately 4.2 cm. Recommend annual imaging followup by CTA or MRA. This recommendation follows 2010 ACCF/AHA/AATS/ACR/ASA/SCA/SCAI/SIR/STS/SVM Guidelines for the Diagnosis and Management of Patients with Thoracic Aortic Disease. Circulation. 2010; 121:: B510-C585 Aortic aneurysm NOS (ICD10-I71.9) 2. Stable aortic atherosclerosis. 3. Stable coronary atherosclerosis. 4. Stable appearance of cardiac enlargement and pacemaker.   01/15/21: CT chest IMPRESSION: 1. Mild aneurysmal disease of the ascending thoracic aorta with maximal caliber of approximately 4.1-4.2 cm. Accurate measurement of the proximal ascending thoracic aorta is difficult due to pulsation artifact. Recommend annual imaging followup by CTA or MRA. This recommendation follows 2010 ACCF/AHA/AATS/ACR/ASA/SCA/SCAI/SIR/STS/SVM Guidelines for the Diagnosis and Management of Patients with  Thoracic Aortic Disease. Circulation. 2010; 121:: I778-E423 Aortic aneurysm NOS (ICD10-I71.9) 2. Coronary atherosclerosis with visualized calcified coronary artery plaque. 3. Mild cardiac enlargement.  Indwelling biventricular pacemaker.   Echo 12/30/20 as per Dr. BClayborne Danaread/note EF 55-60%  RV ok. Mild-moderate dilation of Asc Ao at 4.5 cm    12/30/20: TTE IMPRESSIONS   1. Left ventricular ejection fraction, by estimation, is 55 to 60%. The  left ventricle has normal function. The left ventricle has no regional  wall motion abnormalities. There is mild left ventricular hypertrophy.  Left ventricular diastolic parameters  are consistent with Grade I diastolic dysfunction (impaired relaxation).   2. Right ventricular systolic function is normal. The right ventricular  size is normal. There is normal pulmonary artery systolic pressure.   3. Left atrial size was mildly dilated.   4. The mitral valve is normal in structure. Trivial mitral valve  regurgitation. No evidence of mitral stenosis.   5. The aortic valve is tricuspid. There is mild calcification of the  aortic valve. Aortic valve regurgitation is not visualized. No aortic  stenosis is present.   6. Aortic dilatation noted. There is borderline dilatation of the aortic  root, measuring 39 mm. There is moderate dilatation of the ascending  aorta, measuring 45 mm.   7. The inferior vena cava is normal in size with greater than 50%  respiratory variability, suggesting right atrial pressure of 3 mmHg.    cMRI 9/21: LVEF 29% with dyssynchrony. No LGE. RVEF 59%  Cath 2/21:  1. Mild non-obstructive CAD 2. 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 Well-compensated hemodynamics with low filling pressures   Zio 2/21: 1. NSR - min HR of 39 bpm, avg HR of 67 bpm. 2. Two runs of NSVT  - the longest lasting 8 beats with an avg rate of 105 bpm.  3. 18 runs of SVT - the longest lasting 14.2 secs with an avg rate of 99 bpm. 4.  Frequent PACs (5.3%, 34831)  5, Frequent PVCs (6.2%, 453614 with two priamry morphologies (5.0% and 1.2% respectively)  6. Difficulty discerning atrial activity making definitive diagnosis difficult to ascertain.  Recent Labs: 12/31/2021: ALT 29 07/01/2022: TSH 15.319 07/20/2022: BUN 24; Creatinine, Ser 1.55; Potassium 4.9; Sodium 142  No results found for requested labs within last 365 days.   Estimated Creatinine Clearance: 41.3 mL/min (A) (by C-G formula  based on SCr of 1.55 mg/dL (H)).   Wt Readings from Last 3 Encounters:  07/28/22 179 lb (81.2 kg)  07/01/22 176 lb 6.4 oz (80 kg)  04/09/22 182 lb (82.6 kg)     Other studies reviewed: Additional studies/records reviewed today include: summarized above  ASSESSMENT AND PLAN:  ICD  Intact function LV lead threshold is stable Unclear why his LV/BP pacing is down some, PVC burden appeared low on his monitor nly about 4% or so. PAV/SAV are OK. Given clinically he is doing so well, will for now not make any changes, increasehis nmeds further   NICM Chronic CHF Recovered LVEF by his echo last year No symptoms or exam findings of volume OL HF data looks good On BB, Entresto, spironolactone Follows with HF team   PVCs Amiodarone LFTs ok, TSH is being managed with his PMD No pulm symptoms  Dilated ascending Ao Stable CT, plan/recommended annual imaging    Disposition: back in 28mow/EP, sooner if needed, he will see Dr. TLovena Lenext   Current medicines are reviewed at length with the patient today.  The patient did not have any concerns regarding medicines.  SVenetia Night PA-C 07/28/2022 8:19 AM     [place] [phone](office)  [phone](fax)

## 2022-07-27 ENCOUNTER — Encounter (HOSPITAL_COMMUNITY): Payer: Self-pay | Admitting: Internal Medicine

## 2022-07-28 ENCOUNTER — Encounter: Payer: Self-pay | Admitting: Physician Assistant

## 2022-07-28 ENCOUNTER — Ambulatory Visit: Payer: PPO | Attending: Physician Assistant | Admitting: Physician Assistant

## 2022-07-28 VITALS — BP 132/68 | HR 73 | Ht 67.0 in | Wt 179.0 lb

## 2022-07-28 DIAGNOSIS — I493 Ventricular premature depolarization: Secondary | ICD-10-CM | POA: Diagnosis not present

## 2022-07-28 DIAGNOSIS — Z95810 Presence of automatic (implantable) cardiac defibrillator: Secondary | ICD-10-CM | POA: Diagnosis not present

## 2022-07-28 DIAGNOSIS — I428 Other cardiomyopathies: Secondary | ICD-10-CM | POA: Diagnosis not present

## 2022-07-28 DIAGNOSIS — I5022 Chronic systolic (congestive) heart failure: Secondary | ICD-10-CM

## 2022-07-28 NOTE — Patient Instructions (Signed)
Medication Instructions:  Your physician recommends that you continue on your current medications as directed. Please refer to the Current Medication list given to you today. *If you need a refill on your cardiac medications before your next appointment, please call your pharmacy*   Lab Work: None Ordered    Testing/Procedures: None Ordered    Follow-Up: At [facility], you and your health needs are our priority.  As part of our continuing mission to provide you with exceptional heart care, we have created designated Provider Care Teams.  These Care Teams include your primary Cardiologist (physician) and Advanced Practice Providers (APPs -  Physician Assistants and Nurse Practitioners) who all work together to provide you with the care you need, when you need it.  We recommend signing up for the patient portal called "MyChart".  Sign up information is provided on this After Visit Summary.  MyChart is used to connect with patients for Virtual Visits (Telemedicine).  Patients are able to view lab/test results, encounter notes, upcoming appointments, etc.  Non-urgent messages can be sent to your provider as well.   To learn more about what you can do with MyChart, go to [email or web address].    Your next appointment:   6 month(s)  The format for your next appointment:   In Person  Provider:   [name], MD (ONLY)   Other Instructions   Important Information About Sugar

## 2022-08-24 ENCOUNTER — Ambulatory Visit (INDEPENDENT_AMBULATORY_CARE_PROVIDER_SITE_OTHER): Payer: PPO

## 2022-08-24 DIAGNOSIS — I428 Other cardiomyopathies: Secondary | ICD-10-CM

## 2022-08-24 DIAGNOSIS — Z95810 Presence of automatic (implantable) cardiac defibrillator: Secondary | ICD-10-CM | POA: Diagnosis not present

## 2022-08-27 LAB — CUP PACEART REMOTE DEVICE CHECK
Battery Remaining Longevity: 90 mo
Battery Remaining Percentage: 100 %
Brady Statistic RA Percent Paced: 65 %
Brady Statistic RV Percent Paced: 90 %
Date Time Interrogation Session: 20231101164300
HighPow Impedance: 60 Ohm
Implantable Lead Connection Status: 753985
Implantable Lead Connection Status: 753985
Implantable Lead Connection Status: 753985
Implantable Lead Implant Date: 20211102
Implantable Lead Implant Date: 20211102
Implantable Lead Implant Date: 20211102
Implantable Lead Location: 753858
Implantable Lead Location: 753859
Implantable Lead Location: 753860
Implantable Lead Model: 137
Implantable Lead Model: 4671
Implantable Lead Model: 7840
Implantable Pulse Generator Implant Date: 20211102
Lead Channel Impedance Value: 375 Ohm
Lead Channel Impedance Value: 470 Ohm
Lead Channel Impedance Value: 541 Ohm
Lead Channel Pacing Threshold Amplitude: 0.7 V
Lead Channel Pacing Threshold Amplitude: 1 V
Lead Channel Pacing Threshold Pulse Width: 0.4 ms
Lead Channel Pacing Threshold Pulse Width: 0.4 ms
Lead Channel Setting Pacing Amplitude: 2 V
Lead Channel Setting Pacing Amplitude: 2.2 V
Lead Channel Setting Pacing Amplitude: 3 V
Lead Channel Setting Pacing Pulse Width: 0.4 ms
Lead Channel Setting Pacing Pulse Width: 1 ms
Lead Channel Setting Sensing Sensitivity: 0.5 mV
Lead Channel Setting Sensing Sensitivity: 1 mV
Pulse Gen Serial Number: 387447

## 2022-09-10 ENCOUNTER — Other Ambulatory Visit (HOSPITAL_COMMUNITY): Payer: Self-pay

## 2022-09-11 ENCOUNTER — Other Ambulatory Visit (HOSPITAL_COMMUNITY): Payer: Self-pay

## 2022-09-26 NOTE — Progress Notes (Signed)
Remote ICD transmission.   

## 2022-10-02 ENCOUNTER — Other Ambulatory Visit (HOSPITAL_COMMUNITY): Payer: Self-pay | Admitting: Internal Medicine

## 2022-10-05 ENCOUNTER — Other Ambulatory Visit (HOSPITAL_COMMUNITY): Payer: Self-pay

## 2022-10-05 ENCOUNTER — Telehealth (HOSPITAL_COMMUNITY): Payer: Self-pay | Admitting: Pharmacy Technician

## 2022-10-05 ENCOUNTER — Other Ambulatory Visit (HOSPITAL_COMMUNITY): Payer: Self-pay | Admitting: *Deleted

## 2022-10-05 MED ORDER — SYNTHROID 50 MCG PO TABS
50.0000 ug | ORAL_TABLET | Freq: Every day | ORAL | 3 refills | Status: DC
  Filled 2022-10-05: qty 90, 90d supply, fill #0
  Filled 2023-01-02: qty 100, 100d supply, fill #1
  Filled 2023-01-21: qty 90, 90d supply, fill #1
  Filled 2023-05-14: qty 90, 90d supply, fill #2
  Filled 2023-08-11: qty 90, 90d supply, fill #3

## 2022-10-05 NOTE — Telephone Encounter (Signed)
Advanced Heart Failure Patient Advocate Encounter  Patient called in requesting a refill of Synthroid. He would prefer to have them mailed from our outpatient pharmacy instead of going to [facility] pharmacy and requested a profile transfer. The patient is requesting Amiodarone in addition to Synthroid. Sent 90 day RX request to [facility] Provided central fill patient's payment information.  Charlann Boxer, CPhT

## 2022-10-08 ENCOUNTER — Other Ambulatory Visit (HOSPITAL_COMMUNITY): Payer: Self-pay

## 2022-10-08 ENCOUNTER — Other Ambulatory Visit: Payer: Self-pay

## 2022-10-08 MED ORDER — SACUBITRIL-VALSARTAN 97-103 MG PO TABS: 1.0000 | ORAL_TABLET | Freq: Two times a day (BID) | ORAL | 0 refills | Status: DC

## 2022-10-08 MED ORDER — ALFUZOSIN HCL ER 10 MG PO TB24
10.0000 mg | ORAL_TABLET | Freq: Every evening | ORAL | 1 refills | Status: DC
  Filled 2022-10-13: qty 90, 90d supply, fill #0

## 2022-10-08 MED ORDER — CARVEDILOL 6.25 MG PO TABS
6.2500 mg | ORAL_TABLET | Freq: Two times a day (BID) | ORAL | 8 refills | Status: DC
  Filled 2022-10-20: qty 60, 30d supply, fill #0

## 2022-10-08 MED FILL — Amiodarone HCl Tab 200 MG: ORAL | 30 days supply | Qty: 30 | Fill #0 | Status: AC

## 2022-10-09 ENCOUNTER — Other Ambulatory Visit (HOSPITAL_BASED_OUTPATIENT_CLINIC_OR_DEPARTMENT_OTHER): Payer: Self-pay

## 2022-10-09 ENCOUNTER — Other Ambulatory Visit (HOSPITAL_COMMUNITY): Payer: Self-pay

## 2022-10-13 ENCOUNTER — Other Ambulatory Visit (HOSPITAL_COMMUNITY): Payer: Self-pay

## 2022-10-13 ENCOUNTER — Other Ambulatory Visit: Payer: Self-pay

## 2022-10-13 MED ORDER — FINASTERIDE 5 MG PO TABS
5.0000 mg | ORAL_TABLET | Freq: Every day | ORAL | 2 refills | Status: DC
  Filled 2022-10-13: qty 30, 30d supply, fill #0
  Filled 2022-11-02 – 2022-11-09 (×3): qty 30, 30d supply, fill #1
  Filled 2022-12-09: qty 30, 30d supply, fill #2

## 2022-10-13 MED ORDER — ALFUZOSIN HCL ER 10 MG PO TB24
10.0000 mg | ORAL_TABLET | ORAL | 1 refills | Status: DC | PRN
  Filled 2022-10-13 – 2023-01-10 (×2): qty 90, 90d supply, fill #0

## 2022-10-21 ENCOUNTER — Other Ambulatory Visit: Payer: Self-pay

## 2022-10-23 ENCOUNTER — Other Ambulatory Visit: Payer: Self-pay

## 2022-10-27 ENCOUNTER — Other Ambulatory Visit (HOSPITAL_COMMUNITY): Payer: Self-pay | Admitting: *Deleted

## 2022-10-27 ENCOUNTER — Other Ambulatory Visit (HOSPITAL_COMMUNITY): Payer: Self-pay

## 2022-11-02 ENCOUNTER — Other Ambulatory Visit: Payer: Self-pay

## 2022-11-02 ENCOUNTER — Other Ambulatory Visit (HOSPITAL_COMMUNITY): Payer: Self-pay

## 2022-11-02 MED FILL — Amiodarone HCl Tab 200 MG: ORAL | 30 days supply | Qty: 30 | Fill #1 | Status: AC

## 2022-11-23 ENCOUNTER — Ambulatory Visit: Payer: PPO | Attending: Internal Medicine

## 2022-11-23 DIAGNOSIS — I428 Other cardiomyopathies: Secondary | ICD-10-CM | POA: Diagnosis not present

## 2022-11-23 DIAGNOSIS — I5022 Chronic systolic (congestive) heart failure: Secondary | ICD-10-CM

## 2022-11-24 LAB — CUP PACEART REMOTE DEVICE CHECK
Battery Remaining Longevity: 84 mo
Battery Remaining Percentage: 96 %
Brady Statistic RA Percent Paced: 66 %
Brady Statistic RV Percent Paced: 92 %
Date Time Interrogation Session: 20240129041000
HighPow Impedance: 59 Ohm
Implantable Lead Connection Status: 753985
Implantable Lead Connection Status: 753985
Implantable Lead Connection Status: 753985
Implantable Lead Implant Date: 20211102
Implantable Lead Implant Date: 20211102
Implantable Lead Implant Date: 20211102
Implantable Lead Location: 753858
Implantable Lead Location: 753859
Implantable Lead Location: 753860
Implantable Lead Model: 137
Implantable Lead Model: 4671
Implantable Lead Model: 7840
Implantable Pulse Generator Implant Date: 20211102
Lead Channel Impedance Value: 381 Ohm
Lead Channel Impedance Value: 459 Ohm
Lead Channel Impedance Value: 564 Ohm
Lead Channel Pacing Threshold Amplitude: 0.7 V
Lead Channel Pacing Threshold Amplitude: 1 V
Lead Channel Pacing Threshold Pulse Width: 0.4 ms
Lead Channel Pacing Threshold Pulse Width: 0.4 ms
Lead Channel Setting Pacing Amplitude: 2 V
Lead Channel Setting Pacing Amplitude: 2.2 V
Lead Channel Setting Pacing Amplitude: 3 V
Lead Channel Setting Pacing Pulse Width: 0.4 ms
Lead Channel Setting Pacing Pulse Width: 1 ms
Lead Channel Setting Sensing Sensitivity: 0.5 mV
Lead Channel Setting Sensing Sensitivity: 1 mV
Pulse Gen Serial Number: 387447

## 2022-12-09 ENCOUNTER — Other Ambulatory Visit: Payer: Self-pay

## 2022-12-09 MED FILL — Amiodarone HCl Tab 200 MG: ORAL | 30 days supply | Qty: 30 | Fill #2 | Status: AC

## 2023-01-02 ENCOUNTER — Other Ambulatory Visit (HOSPITAL_COMMUNITY): Payer: Self-pay

## 2023-01-04 ENCOUNTER — Other Ambulatory Visit: Payer: Self-pay

## 2023-01-05 ENCOUNTER — Other Ambulatory Visit (HOSPITAL_COMMUNITY): Payer: Self-pay

## 2023-01-06 NOTE — Progress Notes (Signed)
Remote ICD transmission.   

## 2023-01-08 ENCOUNTER — Other Ambulatory Visit: Payer: Self-pay

## 2023-01-10 ENCOUNTER — Other Ambulatory Visit (HOSPITAL_COMMUNITY): Payer: Self-pay

## 2023-01-11 ENCOUNTER — Other Ambulatory Visit: Payer: Self-pay

## 2023-01-21 ENCOUNTER — Other Ambulatory Visit: Payer: Self-pay

## 2023-01-21 ENCOUNTER — Other Ambulatory Visit (HOSPITAL_COMMUNITY): Payer: Self-pay

## 2023-01-21 MED ORDER — ALFUZOSIN HCL ER 10 MG PO TB24
10.0000 mg | ORAL_TABLET | ORAL | 1 refills | Status: DC | PRN
  Filled 2023-01-21: qty 90, 90d supply, fill #0

## 2023-01-21 MED ORDER — BETHANECHOL CHLORIDE 25 MG PO TABS
25.0000 mg | ORAL_TABLET | Freq: Every day | ORAL | 1 refills | Status: DC
  Filled 2023-01-21: qty 30, 30d supply, fill #0
  Filled 2023-02-14: qty 30, 30d supply, fill #1

## 2023-01-22 ENCOUNTER — Ambulatory Visit: Payer: PPO | Attending: Internal Medicine | Admitting: Internal Medicine

## 2023-01-22 ENCOUNTER — Other Ambulatory Visit (HOSPITAL_COMMUNITY): Payer: Self-pay

## 2023-01-22 ENCOUNTER — Other Ambulatory Visit: Payer: Self-pay

## 2023-01-22 ENCOUNTER — Encounter (HOSPITAL_COMMUNITY): Payer: Self-pay

## 2023-01-22 ENCOUNTER — Encounter: Payer: Self-pay | Admitting: Internal Medicine

## 2023-01-22 VITALS — BP 138/66 | HR 60 | Ht 67.0 in | Wt 175.6 lb

## 2023-01-22 DIAGNOSIS — I428 Other cardiomyopathies: Secondary | ICD-10-CM | POA: Diagnosis not present

## 2023-01-22 DIAGNOSIS — Z95810 Presence of automatic (implantable) cardiac defibrillator: Secondary | ICD-10-CM | POA: Diagnosis not present

## 2023-01-22 DIAGNOSIS — I509 Heart failure, unspecified: Secondary | ICD-10-CM | POA: Insufficient documentation

## 2023-01-22 DIAGNOSIS — I5022 Chronic systolic (congestive) heart failure: Secondary | ICD-10-CM

## 2023-01-22 LAB — CUP PACEART INCLINIC DEVICE CHECK
Date Time Interrogation Session: 20240329093457
HighPow Impedance: 60 Ohm
Implantable Lead Connection Status: 753985
Implantable Lead Connection Status: 753985
Implantable Lead Connection Status: 753985
Implantable Lead Implant Date: 20211102
Implantable Lead Implant Date: 20211102
Implantable Lead Implant Date: 20211102
Implantable Lead Location: 753858
Implantable Lead Location: 753859
Implantable Lead Location: 753860
Implantable Lead Model: 137
Implantable Lead Model: 4671
Implantable Lead Model: 7840
Implantable Pulse Generator Implant Date: 20211102
Lead Channel Impedance Value: 385 Ohm
Lead Channel Impedance Value: 481 Ohm
Lead Channel Impedance Value: 642 Ohm
Lead Channel Pacing Threshold Amplitude: 0.7 V
Lead Channel Pacing Threshold Amplitude: 0.9 V
Lead Channel Pacing Threshold Amplitude: 2.1 V
Lead Channel Pacing Threshold Pulse Width: 0.4 ms
Lead Channel Pacing Threshold Pulse Width: 0.4 ms
Lead Channel Pacing Threshold Pulse Width: 1 ms
Lead Channel Sensing Intrinsic Amplitude: 17.2 mV
Lead Channel Sensing Intrinsic Amplitude: 2.6 mV
Lead Channel Sensing Intrinsic Amplitude: 8.9 mV
Lead Channel Setting Pacing Amplitude: 2 V
Lead Channel Setting Pacing Amplitude: 2.2 V
Lead Channel Setting Pacing Amplitude: 3 V
Lead Channel Setting Pacing Pulse Width: 0.4 ms
Lead Channel Setting Pacing Pulse Width: 1 ms
Lead Channel Setting Sensing Sensitivity: 0.5 mV
Lead Channel Setting Sensing Sensitivity: 1 mV
Pulse Gen Serial Number: 387447

## 2023-01-22 MED ORDER — CARVEDILOL 12.5 MG PO TABS
12.5000 mg | ORAL_TABLET | Freq: Two times a day (BID) | ORAL | 6 refills | Status: DC
  Filled 2023-01-22: qty 90, 45d supply, fill #0
  Filled 2023-03-04: qty 90, 45d supply, fill #1
  Filled 2023-04-28: qty 90, 45d supply, fill #2
  Filled 2023-06-10: qty 90, 45d supply, fill #3
  Filled 2023-08-03: qty 90, 45d supply, fill #4
  Filled 2023-09-08: qty 90, 45d supply, fill #5
  Filled 2023-10-30: qty 90, 45d supply, fill #6

## 2023-01-22 NOTE — Patient Instructions (Signed)
Medication Instructions:  Your physician recommends that you continue on your current medications as directed. Please refer to the Current Medication list given to you today.  *If you need a refill on your cardiac medications before your next appointment, please call your pharmacy*  Lab Work: None ordered.  If you have labs (blood work) drawn today and your tests are completely normal, you will receive your results only by: [place] (if you have MyChart) OR A paper copy in the mail If you have any lab test that is abnormal or we need to change your treatment, we will call you to review the results.  Testing/Procedures: None ordered.  Follow-Up: At [facility], you and your health needs are our priority.  As part of our continuing mission to provide you with exceptional heart care, we have created designated Provider Care Teams.  These Care Teams include your primary Cardiologist (physician) and Advanced Practice Providers (APPs -  Physician Assistants and Nurse Practitioners) who all work together to provide you with the care you need, when you need it.  We recommend signing up for the patient portal called "MyChart".  Sign up information is provided on this After Visit Summary.  MyChart is used to connect with patients for Virtual Visits (Telemedicine).  Patients are able to view lab/test results, encounter notes, upcoming appointments, etc.  Non-urgent messages can be sent to your provider as well.   To learn more about what you can do with MyChart, go to [email or web address].    Your next appointment:   1 year(s)  The format for your next appointment:   In Person  Provider:   [name], MD{or one of the following Advanced Practice Providers on your designated Care Team:   Tommye Standard, [place] Legrand Como "Jonni Sanger" Chalmers Cater, [place]  Remote monitoring is used to monitor your ICD from home. This monitoring reduces the number of office visits required to check your device to one  time per year. It allows [place] to keep an eye on the functioning of your device to ensure it is working properly. You are scheduled for a device check from home on 4/29. You may send your transmission at any time that day. If you have a wireless device, the transmission will be sent automatically. After your physician reviews your transmission, you will receive a postcard with your next transmission date.

## 2023-01-22 NOTE — Progress Notes (Signed)
HPI [name] returns for followup, s/p BIV ICD insertion. He is a pleasant 77 yo man with chronic systolic heart failure initially diagnosed on 4/20. He has been treated with maximal medical therapy. He has less than 10% PVC's on a cardiac monitor. He has not had syncope. He has class 2 symptoms. He still works part time. He has been on amiodarone. He is s/p biv ICD insertion and appears to be doing well.  No Known Allergies   Current Outpatient Medications  Medication Sig Dispense Refill   alfuzosin (UROXATRAL) 10 MG 24 hr tablet Take 1 tablet (10 mg total) by mouth as needed. 90 tablet 1   amiodarone (PACERONE) 200 MG tablet Take 1 tablet (200 mg total) by mouth daily. 30 tablet 2   aspirin EC 81 MG tablet Take 81 mg by mouth daily.     bethanechol (URECHOLINE) 25 MG tablet Take 1 tablet (25 mg total) by mouth daily for urinary retention. 30 tablet 1   cetirizine (ZYRTEC ALLERGY) 10 MG tablet Take 1 tablet every day by oral route as needed.     Cholecalciferol 25 MCG (1000 UT) tablet Take 25 mcg by mouth daily.     fluticasone (FLONASE) 50 MCG/ACT nasal spray Place 1 spray into both nostrils daily as needed for allergies or rhinitis.     furosemide (LASIX) 40 MG tablet Take 1 tablet (40 mg total) by mouth daily as needed. 30 tablet    Multiple Vitamin (MULTIVITAMIN WITH MINERALS) TABS tablet Take 1 tablet by mouth daily.     naproxen (NAPROSYN) 500 MG tablet Take 500 mg by mouth 2 (two) times daily as needed. Pain     nystatin cream (MYCOSTATIN) APPLY TO THE AFFECTED AREA(S) BY TOPICAL ROUTE 2 TIMES PER DAY     sacubitril-valsartan (ENTRESTO) 97-103 MG TAKE 1 TABLET BY MOUTH TWICE DAILY 180 tablet 3   sacubitril-valsartan (ENTRESTO) 97-103 MG Take 1 tablet by mouth 2 (two) times daily. 60 tablet 0   spironolactone (ALDACTONE) 25 MG tablet Take 0.5 tablets (12.5 mg total) by mouth daily. 45 tablet 3   SYNTHROID 50 MCG tablet Take 1 tablet (50 mcg total) by mouth daily before  breakfast. 90 tablet 3   zolpidem (AMBIEN) 5 MG tablet Take 2.5 mg by mouth at bedtime as needed for sleep.      carvedilol (COREG) 12.5 MG tablet Take 1 tablet (12.5 mg total) by mouth 2 (two) times daily. 180 tablet 0   carvedilol (COREG) 6.25 MG tablet Take 1 tablet (6.25 mg total) by mouth 2 (two) times daily. 60 tablet 8   No current facility-administered medications for this visit.     Past Medical History:  Diagnosis Date   CHF (congestive heart failure) ([facility])     ROS:   All systems reviewed and negative except as noted in the HPI.   Past Surgical History:  Procedure Laterality Date   BIV ICD INSERTION CRT-D N/A 08/27/2020   Procedure: BIV ICD INSERTION CRT-D;  Surgeon: Evans Lance, MD;  Location: [place] [facility];  Service: Cardiovascular;  Laterality: N/A;   RIGHT/LEFT HEART CATH AND CORONARY ANGIOGRAPHY N/A 12/19/2019   Procedure: RIGHT/LEFT HEART CATH AND CORONARY ANGIOGRAPHY;  Surgeon: Jolaine Artist, MD;  Location: [place] [facility];  Service: Cardiovascular;  Laterality: N/A;     History reviewed. No pertinent family history.   Social History   Socioeconomic History   Marital status: Single    Spouse name: Not  on file   Number of children: Not on file   Years of education: Not on file   Highest education level: Not on file  Occupational History   Not on file  Tobacco Use   Smoking status: Light Smoker   Smokeless tobacco: Never   Tobacco comments:    social smoker  Substance and Sexual Activity   Alcohol use: Never   Drug use: Not on file   Sexual activity: Not on file  Other Topics Concern   Not on file  Social History Narrative   Not on file   Social Determinants of Health   Financial Resource Strain: Not on file  Food Insecurity: No Food Insecurity (07/01/2022)   Hunger Vital Sign    Worried About Running Out of Food in the Last Year: Never true    Ran Out of Food in the Last Year: Never true  Transportation Needs: No  Transportation Needs (07/01/2022)   PRAPARE - Hydrologist (Medical): No    Lack of Transportation (Non-Medical): No  Physical Activity: Not on file  Stress: Not on file  Social Connections: Not on file  Intimate Partner Violence: Not on file     BP 138/66   Pulse 60   Ht 5\' 7"  (1.702 m)   Wt 175 lb 9.6 oz (79.7 kg)   SpO2 97%   BMI 27.50 kg/m   Physical Exam:  Well appearing NAD HEENT: Unremarkable Neck:  No JVD, no thyromegally Lymphatics:  No adenopathy Back:  No CVA tenderness Lungs:  Clear with no wheezes HEART:  Regular rate rhythm, no murmurs, no rubs, no clicks Abd:  soft, positive bowel sounds, no organomegally, no rebound, no guarding Ext:  2 plus pulses, no edema, no cyanosis, no clubbing Skin:  No rashes no nodules Neuro:  CN II through XII intact, motor grossly intact  EKG - nsr with biv pacing and PVC's  DEVICE  Normal device function.  See PaceArt for details.   Assess/Plan: 1. ICD - His [employer] biv ICD is working normally. We will recheck in several months. His LV is elevated but is stable 2. Chronic systolic heart failure - his symptoms are class 2. No change in his meds.] 3. HTN - his sbp is elevated. He admits to some non-compliance. He is encouraged to reduce his sodium intake. 4. PVC's - he is doing well on 200 mg of amiodarone daily. He is paced over 90% of the time. He did not c/o blurred vision to me today.   Carleene Overlie [place],MD

## 2023-01-23 ENCOUNTER — Other Ambulatory Visit (HOSPITAL_COMMUNITY): Payer: Self-pay | Admitting: Internal Medicine

## 2023-01-25 ENCOUNTER — Other Ambulatory Visit (HOSPITAL_COMMUNITY): Payer: Self-pay

## 2023-01-25 ENCOUNTER — Other Ambulatory Visit: Payer: Self-pay

## 2023-01-25 MED ORDER — AMIODARONE HCL 200 MG PO TABS
200.0000 mg | ORAL_TABLET | Freq: Every day | ORAL | 2 refills | Status: DC
  Filled 2023-01-25: qty 30, 30d supply, fill #0
  Filled 2023-02-24: qty 30, 30d supply, fill #1
  Filled 2023-04-02: qty 30, 30d supply, fill #2

## 2023-02-15 ENCOUNTER — Other Ambulatory Visit: Payer: Self-pay

## 2023-02-22 ENCOUNTER — Ambulatory Visit (INDEPENDENT_AMBULATORY_CARE_PROVIDER_SITE_OTHER): Payer: PPO

## 2023-02-22 DIAGNOSIS — I428 Other cardiomyopathies: Secondary | ICD-10-CM

## 2023-02-23 LAB — CUP PACEART REMOTE DEVICE CHECK
Battery Remaining Longevity: 78 mo
Battery Remaining Percentage: 88 %
Brady Statistic RA Percent Paced: 60 %
Brady Statistic RV Percent Paced: 95 %
Date Time Interrogation Session: 20240429041100
HighPow Impedance: 61 Ohm
Implantable Lead Connection Status: 753985
Implantable Lead Connection Status: 753985
Implantable Lead Connection Status: 753985
Implantable Lead Implant Date: 20211102
Implantable Lead Implant Date: 20211102
Implantable Lead Implant Date: 20211102
Implantable Lead Location: 753858
Implantable Lead Location: 753859
Implantable Lead Location: 753860
Implantable Lead Model: 137
Implantable Lead Model: 4671
Implantable Lead Model: 7840
Implantable Pulse Generator Implant Date: 20211102
Lead Channel Impedance Value: 376 Ohm
Lead Channel Impedance Value: 481 Ohm
Lead Channel Impedance Value: 490 Ohm
Lead Channel Pacing Threshold Amplitude: 0.7 V
Lead Channel Pacing Threshold Amplitude: 0.8 V
Lead Channel Pacing Threshold Pulse Width: 0.4 ms
Lead Channel Pacing Threshold Pulse Width: 0.4 ms
Lead Channel Setting Pacing Amplitude: 2 V
Lead Channel Setting Pacing Amplitude: 2.2 V
Lead Channel Setting Pacing Amplitude: 3 V
Lead Channel Setting Pacing Pulse Width: 0.4 ms
Lead Channel Setting Pacing Pulse Width: 1 ms
Lead Channel Setting Sensing Sensitivity: 0.5 mV
Lead Channel Setting Sensing Sensitivity: 1 mV
Pulse Gen Serial Number: 387447

## 2023-02-24 ENCOUNTER — Other Ambulatory Visit (HOSPITAL_COMMUNITY): Payer: Self-pay

## 2023-02-24 ENCOUNTER — Other Ambulatory Visit: Payer: Self-pay

## 2023-03-05 ENCOUNTER — Other Ambulatory Visit: Payer: Self-pay

## 2023-03-05 ENCOUNTER — Other Ambulatory Visit (HOSPITAL_COMMUNITY): Payer: Self-pay

## 2023-03-08 ENCOUNTER — Other Ambulatory Visit: Payer: Self-pay

## 2023-03-10 ENCOUNTER — Other Ambulatory Visit (HOSPITAL_COMMUNITY): Payer: Self-pay

## 2023-03-10 ENCOUNTER — Other Ambulatory Visit: Payer: Self-pay

## 2023-03-10 MED ORDER — BETHANECHOL CHLORIDE 50 MG PO TABS
50.0000 mg | ORAL_TABLET | Freq: Two times a day (BID) | ORAL | 3 refills | Status: DC
  Filled 2023-03-10: qty 60, 30d supply, fill #0
  Filled 2023-04-05: qty 60, 30d supply, fill #1
  Filled 2023-08-11 – 2023-11-03 (×9): qty 60, 30d supply, fill #2

## 2023-03-11 ENCOUNTER — Other Ambulatory Visit: Payer: Self-pay

## 2023-03-25 NOTE — Progress Notes (Signed)
Remote ICD transmission.   

## 2023-04-02 ENCOUNTER — Other Ambulatory Visit (HOSPITAL_COMMUNITY): Payer: Self-pay

## 2023-04-02 ENCOUNTER — Other Ambulatory Visit: Payer: Self-pay

## 2023-04-02 ENCOUNTER — Telehealth (HOSPITAL_COMMUNITY): Payer: Self-pay | Admitting: Cardiology

## 2023-04-02 MED ORDER — MEXILETINE HCL 200 MG PO CAPS
200.0000 mg | ORAL_CAPSULE | Freq: Two times a day (BID) | ORAL | 11 refills | Status: DC
  Filled 2023-04-02: qty 60, 30d supply, fill #0
  Filled 2023-05-14: qty 60, 30d supply, fill #1
  Filled 2023-06-10: qty 60, 30d supply, fill #2
  Filled 2023-07-14 (×2): qty 60, 30d supply, fill #3
  Filled 2023-08-11: qty 60, 30d supply, fill #4
  Filled 2023-09-08: qty 60, 30d supply, fill #5
  Filled 2023-10-10: qty 60, 30d supply, fill #6
  Filled 2023-11-06: qty 60, 30d supply, fill #7
  Filled 2023-12-06 (×2): qty 60, 30d supply, fill #8
  Filled 2024-01-18: qty 60, 30d supply, fill #9
  Filled 2024-02-15: qty 60, 30d supply, fill #10
  Filled 2024-03-21: qty 60, 30d supply, fill #11

## 2023-04-02 NOTE — Telephone Encounter (Signed)
patient called stating that his eye doc told him that the amiodarone is giving him flakes in his implant (messing with vision) and wants to know if he can be changed to something else since its messing with his vision    he stated that he is suppose to go to get laser surgery I think in July but wanted to see if something could be done to have him changed

## 2023-04-02 NOTE — Telephone Encounter (Signed)
Pt aware.

## 2023-04-05 ENCOUNTER — Other Ambulatory Visit: Payer: Self-pay

## 2023-04-05 ENCOUNTER — Other Ambulatory Visit (HOSPITAL_COMMUNITY): Payer: Self-pay

## 2023-04-21 ENCOUNTER — Other Ambulatory Visit (HOSPITAL_COMMUNITY): Payer: Self-pay

## 2023-04-22 ENCOUNTER — Other Ambulatory Visit (HOSPITAL_COMMUNITY): Payer: Self-pay

## 2023-04-22 ENCOUNTER — Other Ambulatory Visit: Payer: Self-pay

## 2023-04-22 MED ORDER — ALFUZOSIN HCL ER 10 MG PO TB24
10.0000 mg | ORAL_TABLET | Freq: Every day | ORAL | 1 refills | Status: DC
  Filled 2023-04-22: qty 90, 90d supply, fill #0

## 2023-04-22 MED ORDER — BETHANECHOL CHLORIDE 50 MG PO TABS
50.0000 mg | ORAL_TABLET | Freq: Two times a day (BID) | ORAL | 3 refills | Status: DC
  Filled 2023-04-22: qty 60, 30d supply, fill #0
  Filled 2023-06-23: qty 60, 30d supply, fill #1

## 2023-04-28 ENCOUNTER — Other Ambulatory Visit: Payer: Self-pay

## 2023-04-28 ENCOUNTER — Other Ambulatory Visit (HOSPITAL_COMMUNITY): Payer: Self-pay

## 2023-05-14 ENCOUNTER — Other Ambulatory Visit (HOSPITAL_COMMUNITY): Payer: Self-pay

## 2023-05-24 ENCOUNTER — Ambulatory Visit: Payer: PPO

## 2023-05-24 DIAGNOSIS — I428 Other cardiomyopathies: Secondary | ICD-10-CM | POA: Diagnosis not present

## 2023-06-09 NOTE — Progress Notes (Signed)
Remote ICD transmission.   

## 2023-06-10 ENCOUNTER — Other Ambulatory Visit (HOSPITAL_COMMUNITY): Payer: Self-pay

## 2023-06-10 ENCOUNTER — Other Ambulatory Visit: Payer: Self-pay

## 2023-06-10 ENCOUNTER — Encounter (HOSPITAL_COMMUNITY): Payer: Self-pay

## 2023-06-10 ENCOUNTER — Other Ambulatory Visit (HOSPITAL_COMMUNITY): Payer: Self-pay | Admitting: Internal Medicine

## 2023-06-10 MED ORDER — ENTRESTO 97-103 MG PO TABS
1.0000 | ORAL_TABLET | Freq: Two times a day (BID) | ORAL | 0 refills | Status: DC
  Filled 2023-06-10: qty 180, 90d supply, fill #0
  Filled 2023-06-10: qty 60, 30d supply, fill #0
  Filled 2023-06-11: qty 180, 90d supply, fill #0

## 2023-06-11 ENCOUNTER — Other Ambulatory Visit: Payer: Self-pay

## 2023-06-11 ENCOUNTER — Telehealth (HOSPITAL_COMMUNITY): Payer: Self-pay | Admitting: Pharmacy Technician

## 2023-06-11 ENCOUNTER — Other Ambulatory Visit (HOSPITAL_COMMUNITY): Payer: Self-pay

## 2023-06-11 NOTE — Telephone Encounter (Signed)
Advanced Heart Failure Patient Advocate Encounter  The patient was approved for a [employer] grant that will help cover the cost of Entresto. Total amount awarded, $10,000. Eligibility, 05/12/23 - 05/10/24.  ID [id number]  BIN [id number]  PCN [id number]  Group [id number]  Added billing information to [facility] Attempted to call patient. Replied to outpatient pharmacy message regarding high co-pay.  Archer Asa, CPhT

## 2023-06-23 ENCOUNTER — Other Ambulatory Visit (HOSPITAL_COMMUNITY): Payer: Self-pay

## 2023-06-23 ENCOUNTER — Other Ambulatory Visit: Payer: Self-pay

## 2023-06-23 MED ORDER — ZOLPIDEM TARTRATE 5 MG PO TABS
5.0000 mg | ORAL_TABLET | Freq: Every evening | ORAL | 0 refills | Status: DC
  Filled 2023-06-23: qty 30, 30d supply, fill #0

## 2023-06-24 ENCOUNTER — Other Ambulatory Visit (HOSPITAL_COMMUNITY): Payer: Self-pay

## 2023-07-14 ENCOUNTER — Other Ambulatory Visit: Payer: Self-pay

## 2023-07-14 ENCOUNTER — Other Ambulatory Visit (HOSPITAL_COMMUNITY): Payer: Self-pay | Admitting: Family Medicine

## 2023-07-14 ENCOUNTER — Other Ambulatory Visit (HOSPITAL_COMMUNITY): Payer: Self-pay

## 2023-07-14 DIAGNOSIS — I5022 Chronic systolic (congestive) heart failure: Secondary | ICD-10-CM

## 2023-07-14 DIAGNOSIS — I428 Other cardiomyopathies: Secondary | ICD-10-CM

## 2023-07-14 MED ORDER — BETHANECHOL CHLORIDE 50 MG PO TABS
ORAL_TABLET | ORAL | 3 refills | Status: DC
  Filled 2023-07-14: qty 90, 30d supply, fill #0
  Filled 2023-08-16: qty 90, 30d supply, fill #1
  Filled 2023-09-08: qty 90, 30d supply, fill #2
  Filled 2023-10-30 – 2023-11-06 (×4): qty 90, 30d supply, fill #3

## 2023-07-14 MED ORDER — SPIRONOLACTONE 25 MG PO TABS
12.5000 mg | ORAL_TABLET | Freq: Every day | ORAL | 0 refills | Status: DC
Start: 2023-07-14 — End: 2023-10-06
  Filled 2023-07-14: qty 45, 90d supply, fill #0

## 2023-07-15 ENCOUNTER — Other Ambulatory Visit: Payer: Self-pay

## 2023-07-24 ENCOUNTER — Other Ambulatory Visit (HOSPITAL_COMMUNITY): Payer: Self-pay

## 2023-07-26 ENCOUNTER — Other Ambulatory Visit: Payer: Self-pay

## 2023-08-03 ENCOUNTER — Other Ambulatory Visit (HOSPITAL_COMMUNITY): Payer: Self-pay

## 2023-08-03 ENCOUNTER — Other Ambulatory Visit: Payer: Self-pay

## 2023-08-11 ENCOUNTER — Other Ambulatory Visit (HOSPITAL_COMMUNITY): Payer: Self-pay

## 2023-08-11 ENCOUNTER — Other Ambulatory Visit: Payer: Self-pay

## 2023-08-11 MED ORDER — ZOLPIDEM TARTRATE 5 MG PO TABS
5.0000 mg | ORAL_TABLET | Freq: Every evening | ORAL | 0 refills | Status: DC | PRN
  Filled 2023-08-11: qty 30, 30d supply, fill #0

## 2023-08-16 ENCOUNTER — Other Ambulatory Visit (HOSPITAL_COMMUNITY): Payer: Self-pay

## 2023-08-16 ENCOUNTER — Other Ambulatory Visit: Payer: Self-pay

## 2023-08-23 ENCOUNTER — Ambulatory Visit (INDEPENDENT_AMBULATORY_CARE_PROVIDER_SITE_OTHER): Payer: PPO

## 2023-08-23 DIAGNOSIS — I428 Other cardiomyopathies: Secondary | ICD-10-CM

## 2023-08-23 DIAGNOSIS — I5022 Chronic systolic (congestive) heart failure: Secondary | ICD-10-CM

## 2023-08-25 LAB — CUP PACEART REMOTE DEVICE CHECK
Battery Remaining Longevity: 72 mo
Battery Remaining Percentage: 81 %
Brady Statistic RA Percent Paced: 47 %
Brady Statistic RV Percent Paced: 94 %
Date Time Interrogation Session: 20241028041100
HighPow Impedance: 62 Ohm
Implantable Lead Connection Status: 753985
Implantable Lead Connection Status: 753985
Implantable Lead Connection Status: 753985
Implantable Lead Implant Date: 20211102
Implantable Lead Implant Date: 20211102
Implantable Lead Implant Date: 20211102
Implantable Lead Location: 753858
Implantable Lead Location: 753859
Implantable Lead Location: 753860
Implantable Lead Model: 137
Implantable Lead Model: 4671
Implantable Lead Model: 7840
Implantable Pulse Generator Implant Date: 20211102
Lead Channel Impedance Value: 376 Ohm
Lead Channel Impedance Value: 445 Ohm
Lead Channel Impedance Value: 453 Ohm
Lead Channel Pacing Threshold Amplitude: 0.5 V
Lead Channel Pacing Threshold Amplitude: 0.8 V
Lead Channel Pacing Threshold Pulse Width: 0.4 ms
Lead Channel Pacing Threshold Pulse Width: 0.4 ms
Lead Channel Setting Pacing Amplitude: 2 V
Lead Channel Setting Pacing Amplitude: 2.2 V
Lead Channel Setting Pacing Amplitude: 3 V
Lead Channel Setting Pacing Pulse Width: 0.4 ms
Lead Channel Setting Pacing Pulse Width: 1 ms
Lead Channel Setting Sensing Sensitivity: 0.5 mV
Lead Channel Setting Sensing Sensitivity: 1 mV
Pulse Gen Serial Number: 387447

## 2023-09-08 ENCOUNTER — Other Ambulatory Visit (HOSPITAL_COMMUNITY): Payer: Self-pay

## 2023-09-08 ENCOUNTER — Other Ambulatory Visit: Payer: Self-pay

## 2023-09-08 ENCOUNTER — Other Ambulatory Visit (HOSPITAL_COMMUNITY): Payer: Self-pay | Admitting: Internal Medicine

## 2023-09-08 MED ORDER — ENTRESTO 97-103 MG PO TABS
1.0000 | ORAL_TABLET | Freq: Two times a day (BID) | ORAL | 0 refills | Status: DC
  Filled 2023-09-08: qty 180, 90d supply, fill #0

## 2023-09-08 MED ORDER — ZOLPIDEM TARTRATE 5 MG PO TABS
5.0000 mg | ORAL_TABLET | Freq: Every evening | ORAL | 0 refills | Status: DC | PRN
  Filled 2023-09-08 – 2023-09-09 (×2): qty 30, 30d supply, fill #0

## 2023-09-09 ENCOUNTER — Other Ambulatory Visit (HOSPITAL_COMMUNITY): Payer: Self-pay

## 2023-09-09 ENCOUNTER — Other Ambulatory Visit: Payer: Self-pay

## 2023-09-10 NOTE — Progress Notes (Signed)
Remote ICD transmission.   

## 2023-09-14 ENCOUNTER — Other Ambulatory Visit (HOSPITAL_COMMUNITY): Payer: Self-pay

## 2023-09-14 ENCOUNTER — Other Ambulatory Visit: Payer: Self-pay

## 2023-09-14 MED ORDER — ALFUZOSIN HCL ER 10 MG PO TB24
10.0000 mg | ORAL_TABLET | Freq: Every day | ORAL | 1 refills | Status: DC
  Filled 2023-09-14: qty 90, 90d supply, fill #0

## 2023-09-14 MED ORDER — BETHANECHOL CHLORIDE 50 MG PO TABS
50.0000 mg | ORAL_TABLET | Freq: Three times a day (TID) | ORAL | 6 refills | Status: DC
  Filled 2023-09-28 – 2023-10-04 (×4): qty 90, 30d supply, fill #0
  Filled 2023-10-30: qty 90, 30d supply, fill #1
  Filled 2023-12-06 (×2): qty 90, 30d supply, fill #2
  Filled 2024-01-18: qty 90, 30d supply, fill #3
  Filled 2024-02-24: qty 90, 30d supply, fill #4
  Filled 2024-03-21: qty 90, 30d supply, fill #5
  Filled 2024-04-20: qty 90, 30d supply, fill #6

## 2023-09-29 ENCOUNTER — Other Ambulatory Visit: Payer: Self-pay

## 2023-09-29 ENCOUNTER — Other Ambulatory Visit (HOSPITAL_COMMUNITY): Payer: Self-pay

## 2023-10-02 ENCOUNTER — Other Ambulatory Visit (HOSPITAL_BASED_OUTPATIENT_CLINIC_OR_DEPARTMENT_OTHER): Payer: Self-pay

## 2023-10-04 ENCOUNTER — Other Ambulatory Visit (HOSPITAL_COMMUNITY): Payer: Self-pay

## 2023-10-04 ENCOUNTER — Other Ambulatory Visit: Payer: Self-pay

## 2023-10-05 ENCOUNTER — Other Ambulatory Visit: Payer: Self-pay

## 2023-10-06 ENCOUNTER — Other Ambulatory Visit: Payer: Self-pay

## 2023-10-06 ENCOUNTER — Other Ambulatory Visit (HOSPITAL_COMMUNITY): Payer: Self-pay | Admitting: Family Medicine

## 2023-10-06 ENCOUNTER — Other Ambulatory Visit (HOSPITAL_COMMUNITY): Payer: Self-pay

## 2023-10-06 DIAGNOSIS — I5022 Chronic systolic (congestive) heart failure: Secondary | ICD-10-CM

## 2023-10-06 DIAGNOSIS — I428 Other cardiomyopathies: Secondary | ICD-10-CM

## 2023-10-06 MED ORDER — SPIRONOLACTONE 25 MG PO TABS
12.5000 mg | ORAL_TABLET | Freq: Every day | ORAL | 0 refills | Status: AC
  Filled 2023-10-06 – 2023-10-10 (×2): qty 45, 90d supply, fill #0

## 2023-10-11 ENCOUNTER — Other Ambulatory Visit (HOSPITAL_COMMUNITY): Payer: Self-pay

## 2023-10-11 ENCOUNTER — Other Ambulatory Visit: Payer: Self-pay

## 2023-10-30 ENCOUNTER — Other Ambulatory Visit (HOSPITAL_COMMUNITY): Payer: Self-pay

## 2023-11-01 ENCOUNTER — Other Ambulatory Visit: Payer: Self-pay

## 2023-11-02 ENCOUNTER — Other Ambulatory Visit: Payer: Self-pay

## 2023-11-03 ENCOUNTER — Other Ambulatory Visit (HOSPITAL_COMMUNITY): Payer: Self-pay | Admitting: Internal Medicine

## 2023-11-04 ENCOUNTER — Other Ambulatory Visit (HOSPITAL_COMMUNITY): Payer: Self-pay

## 2023-11-06 ENCOUNTER — Other Ambulatory Visit (HOSPITAL_COMMUNITY): Payer: Self-pay

## 2023-11-08 ENCOUNTER — Other Ambulatory Visit: Payer: Self-pay

## 2023-11-22 ENCOUNTER — Ambulatory Visit (INDEPENDENT_AMBULATORY_CARE_PROVIDER_SITE_OTHER): Payer: PPO

## 2023-11-22 ENCOUNTER — Encounter: Payer: Self-pay | Admitting: Internal Medicine

## 2023-11-22 DIAGNOSIS — I428 Other cardiomyopathies: Secondary | ICD-10-CM

## 2023-11-22 DIAGNOSIS — I5022 Chronic systolic (congestive) heart failure: Secondary | ICD-10-CM

## 2023-11-22 LAB — CUP PACEART REMOTE DEVICE CHECK
Battery Remaining Longevity: 66 mo
Battery Remaining Percentage: 77 %
Brady Statistic RA Percent Paced: 43 %
Brady Statistic RV Percent Paced: 94 %
Date Time Interrogation Session: 20250127041100
HighPow Impedance: 62 Ohm
Implantable Lead Connection Status: 753985
Implantable Lead Connection Status: 753985
Implantable Lead Connection Status: 753985
Implantable Lead Implant Date: 20211102
Implantable Lead Implant Date: 20211102
Implantable Lead Implant Date: 20211102
Implantable Lead Location: 753858
Implantable Lead Location: 753859
Implantable Lead Location: 753860
Implantable Lead Model: 137
Implantable Lead Model: 4671
Implantable Lead Model: 7840
Implantable Pulse Generator Implant Date: 20211102
Lead Channel Impedance Value: 378 Ohm
Lead Channel Impedance Value: 423 Ohm
Lead Channel Impedance Value: 470 Ohm
Lead Channel Pacing Threshold Amplitude: 0.5 V
Lead Channel Pacing Threshold Amplitude: 0.8 V
Lead Channel Pacing Threshold Pulse Width: 0.4 ms
Lead Channel Pacing Threshold Pulse Width: 0.4 ms
Lead Channel Setting Pacing Amplitude: 2 V
Lead Channel Setting Pacing Amplitude: 2.2 V
Lead Channel Setting Pacing Amplitude: 3 V
Lead Channel Setting Pacing Pulse Width: 0.4 ms
Lead Channel Setting Pacing Pulse Width: 1 ms
Lead Channel Setting Sensing Sensitivity: 0.5 mV
Lead Channel Setting Sensing Sensitivity: 1 mV
Pulse Gen Serial Number: 387447

## 2023-11-28 NOTE — Progress Notes (Signed)
Advanced Heart Failure Clinic Note   Primary Care: Shelle Iron, MD EP: Dr. Ladona Ridgel HF Cardiologist: Dr. Gala Romney  HPI: Bryce Cruz is a 78 y.o. with h/o colon cancer (resected 12/20. No chemo).  Admitted to [facility] 4/20 with acute HF. Echo showed EF 20%. Also found to have LBBB.    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 EF did not improve with PVC suppression and felt to be more likely LBBB CM.   cMRI 9/21: LVEF 29% with dyssynchrony. No LGE. RVEF 59%   Underwent BosSci CRT-D implant with Dr. Ladona Ridgel on 08/27/20   Echo 12/30/20 EF 55-60%  RV ok. Mild-moderate dilation of Asc Ao at 4.5 cm    Last seen in HF Clinic 9/23. Saw eye doctor in 6/24 and felt amio deposits were affecting his vision. Switched to [employer]    Today he returns for HF follow up. Overall feeling fine. Occasional SOB bending over but otherwise no issues. 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 Denies palpitations, CP, dizziness, edema, or PND/Orthopnea. Appetite ok. No fever or chills. Weight at home 175 pounds. Taking all medications. Has not needed Lasix.   Cardiac Studies:  - Zio 3 day (6/23): Mostly NSR w/ AV pacing, occasional PVCs, rare PACs, brief NSVT  - Echo 3/22 EF 55-60%, RV ok, mild to moderate dilation of Asc Ao 4.5 cm   - Cath 2/21:  1. Mild non-obstructive CAD 2. 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 Well-compensated hemodynamics with low filling pressures   - Zio 2/21: 1. NSR - min HR of 39 bpm, avg HR of 67 bpm. 2. Two runs of NSVT  - the longest lasting 8 beats with an avg rate of 105 bpm.  3. 18 runs of SVT - the longest lasting 14.2 secs with an avg rate of 99 bpm. 4. Frequent PACs (5.3%, 34831)  5, Frequent PVCs (6.2%, 78295) with two priamry morphologies (5.0% and 1.2% respectively)  6. Difficulty discerning atrial activity making definitive diagnosis difficult to  ascertain.   ROS: All systems reviewed and negative except as per HPI.   Past Medical History:  Diagnosis Date   CHF (congestive heart failure) (HCC)    Current Outpatient Medications  Medication Sig Dispense Refill   alfuzosin (UROXATRAL) 10 MG 24 hr tablet Take 1 tablet (10 mg total) by mouth as needed. 90 tablet 1   alfuzosin (UROXATRAL) 10 MG 24 hr tablet Take 1 tablet (10 mg total) by mouth daily. 90 tablet 1   alfuzosin (UROXATRAL) 10 MG 24 hr tablet Take 1 tablet (10 mg total) by mouth daily. 90 tablet 1   aspirin EC 81 MG tablet Take 81 mg by mouth daily.     bethanechol (URECHOLINE) 25 MG tablet Take 1 tablet (25 mg total) by mouth daily for urinary retention. 30 tablet 1   bethanechol (URECHOLINE) 50 MG tablet Take 1 tablet (50 mg total) by mouth 2 (two) times daily for urinary retention. 60 tablet 3   bethanechol (URECHOLINE) 50 MG tablet Take 1 tablet (50 mg total) by mouth 2 (two) times daily for urinary retention 60 tablet 3   bethanechol (URECHOLINE) 50 MG tablet 1 (one) tablet by mouth three times a day for urinary retention 90 tablet 3   bethanechol (URECHOLINE) 50 MG tablet Take 1 tablet (50 mg total) by mouth 3 (three) times daily for urinary retention 90 tablet 6  carvedilol (COREG) 12.5 MG tablet Take 1 tablet (12.5 mg total) by mouth 2 (two) times daily. 90 tablet 6   carvedilol (COREG) 6.25 MG tablet Take 1 tablet (6.25 mg total) by mouth 2 (two) times daily. 60 tablet 8   cetirizine (ZYRTEC ALLERGY) 10 MG tablet Take 1 tablet every day by oral route as needed.     Cholecalciferol 25 MCG (1000 UT) tablet Take 25 mcg by mouth daily.     fluticasone (FLONASE) 50 MCG/ACT nasal spray Place 1 spray into both nostrils daily as needed for allergies or rhinitis.     furosemide (LASIX) 40 MG tablet Take 1 tablet (40 mg total) by mouth daily as needed. 30 tablet    mexiletine (MEXITIL) 200 MG capsule Take 1 capsule (200 mg total) by mouth 2 (two) times daily. 60 capsule 11    Multiple Vitamin (MULTIVITAMIN WITH MINERALS) TABS tablet Take 1 tablet by mouth daily.     naproxen (NAPROSYN) 500 MG tablet Take 500 mg by mouth 2 (two) times daily as needed. Pain     nystatin cream (MYCOSTATIN) APPLY TO THE AFFECTED AREA(S) BY TOPICAL ROUTE 2 TIMES PER DAY     sacubitril-valsartan (ENTRESTO) 97-103 MG Take 1 tablet by mouth 2 (two) times daily. 60 tablet 0   sacubitril-valsartan (ENTRESTO) 97-103 MG Take 1 tablet by mouth 2 (two) times daily. NEEDS FOLLOW UP APPOINTMENT FOR MORE REFILLS 180 tablet 0   spironolactone (ALDACTONE) 25 MG tablet Take 0.5 tablets (12.5 mg total) by mouth daily. NEEDS FOLLOW UP APPOINTMENT FOR MORE REFILLS 45 tablet 0   SYNTHROID 50 MCG tablet Take 1 tablet (50 mcg total) by mouth daily before breakfast. 90 tablet 3   zolpidem (AMBIEN) 5 MG tablet Take 2.5 mg by mouth at bedtime as needed for sleep.      zolpidem (AMBIEN) 5 MG tablet Take 1 tablet (5 mg total) by mouth at bedtime as needed for insomnia 30 tablet 0   No current facility-administered medications for this encounter.   No Known Allergies  Social History   Socioeconomic History   Marital status: Single    Spouse name: Not on file   Number of children: Not on file   Years of education: Not on file   Highest education level: Not on file  Occupational History   Not on file  Tobacco Use   Smoking status: Light Smoker   Smokeless tobacco: Never   Tobacco comments:    social smoker  Substance and Sexual Activity   Alcohol use: Never   Drug use: Not on file   Sexual activity: Not on file  Other Topics Concern   Not on file  Social History Narrative   Not on file   Social Drivers of Health   Financial Resource Strain: Not on file  Food Insecurity: No Food Insecurity (07/01/2022)   Hunger Vital Sign    Worried About Running Out of Food in the Last Year: Never true    Ran Out of Food in the Last Year: Never true  Transportation Needs: No Transportation Needs (07/01/2022)    PRAPARE - Administrator, Civil Service (Medical): No    Lack of Transportation (Non-Medical): No  Physical Activity: Not on file  Stress: Not on file  Social Connections: Not on file  Intimate Partner Violence: Not on file    No family history on file. Family History:  Mom - CAD s/p CABG deceased Dad - died from SCD (heavy smoker and drinker)  Sister with PPM Younger sister is disabled  There were no vitals taken for this visit.  Wt Readings from Last 3 Encounters:  01/22/23 79.7 kg (175 lb 9.6 oz)  07/28/22 81.2 kg (179 lb)  07/01/22 80 kg (176 lb 6.4 oz)   PHYSICAL EXAM: General:  NAD. No resp difficulty HEENT: Normal Neck: Supple. No JVD. Carotids 2+ bilat; no bruits. No lymphadenopathy or thryomegaly appreciated. Cor: PMI nondisplaced. Regular rate & rhythm. No rubs, gallops or murmurs. Lungs: Clear Abdomen: Soft, nontender, nondistended. No hepatosplenomegaly. No bruits or masses. Good bowel sounds. Extremities: No cyanosis, clubbing, rash, edema Neuro: Alert & oriented x 3, cranial nerves grossly intact. Moves all 4 extremities w/o difficulty. Affect pleasant.  BoSCI CRT-D: HL score 3. No VT/AF. 81% LV pacing  Activity level 1.8 h/day Personally reviewed  ASSESSMENT & PLAN:  1.  Chronic systolic HF with recovered EF due to NICM (suspect LBBB) - new diagnosis 4/20 - Cath 4/20. Minimal CAD. EF 20-25% - Etiology unclear. Possibly LBBB-CM (was told his ECG was a bit abnormal prior to colon cancer surgery) vs frequent PVCs - PVCs suppressed with amio but EF still 25% with marked dyssynchrony (06/25/20) -> suspect LBBB CM - Echo 8/21 EF 25% - cMRI 9/21: LVEF 29% with dyssynchrony. No LGE. RVEF 59% - s/p BosSci CRT-D implant with Dr. Ladona Ridgel on 08/27/20 - Echo (12/30/20): EF 55-60%, RV ok. Mild-moderate dilation of Asc Ao at 4.5 cm  - NYHA I. Volume status looks good. - Restart spiro 12.5 mg daily.   - Continue Entresto 97/103 mg bid. - Continue Coreg 12.5 mg  bid. - Continue Lasix PRN only  - Due for repeat ECHO    2. Colon CA - s/p resection  - Stable in remission - Refused chemo   3. Frequent PVCs - Quiescent - 3-day Zio (6/23) - 4.7% PVCs  - Suspect LBBB caused NICM and probably not PVCs.  - Switch to mexilitene 6/24 due to ocular SEs  4. Asc Ao dilation - Echo 12/30/20 EF 55-60%  RV ok. Mild-moderate dilation of Asc Ao at 4.5 cm  - CTA chest stable 4.2 cm (3/23).  - Needs repeat imaging  Arvilla Meres, MD  10:53 PM    [facility] [address] [facility] [place] [address] [phone] (office) [phone] (fax)

## 2023-11-29 ENCOUNTER — Other Ambulatory Visit (HOSPITAL_COMMUNITY): Payer: Self-pay | Admitting: Internal Medicine

## 2023-11-29 ENCOUNTER — Encounter (HOSPITAL_COMMUNITY): Payer: Self-pay | Admitting: Internal Medicine

## 2023-11-29 ENCOUNTER — Other Ambulatory Visit (HOSPITAL_COMMUNITY): Payer: Self-pay

## 2023-11-29 ENCOUNTER — Ambulatory Visit (HOSPITAL_COMMUNITY)
Admission: RE | Admit: 2023-11-29 | Discharge: 2023-11-29 | Disposition: A | Discharge status: Home / Self Care | Payer: PPO | Source: Ambulatory Visit | Attending: Internal Medicine | Admitting: Internal Medicine

## 2023-11-29 ENCOUNTER — Inpatient Hospital Stay (HOSPITAL_COMMUNITY)
Admission: RE | Admit: 2023-11-29 | Discharge: 2023-11-29 | Disposition: A | Discharge status: Home / Self Care | Payer: PPO | Source: Ambulatory Visit | Attending: Internal Medicine | Admitting: Internal Medicine

## 2023-11-29 VITALS — BP 128/80 | HR 66 | Wt 169.8 lb

## 2023-11-29 DIAGNOSIS — I5022 Chronic systolic (congestive) heart failure: Secondary | ICD-10-CM | POA: Diagnosis present

## 2023-11-29 DIAGNOSIS — Z79899 Other long term (current) drug therapy: Secondary | ICD-10-CM | POA: Insufficient documentation

## 2023-11-29 DIAGNOSIS — I2510 Atherosclerotic heart disease of native coronary artery without angina pectoris: Secondary | ICD-10-CM | POA: Diagnosis not present

## 2023-11-29 DIAGNOSIS — I428 Other cardiomyopathies: Secondary | ICD-10-CM | POA: Diagnosis not present

## 2023-11-29 DIAGNOSIS — I493 Ventricular premature depolarization: Secondary | ICD-10-CM

## 2023-11-29 DIAGNOSIS — I447 Left bundle-branch block, unspecified: Secondary | ICD-10-CM | POA: Insufficient documentation

## 2023-11-29 DIAGNOSIS — I77810 Thoracic aortic ectasia: Secondary | ICD-10-CM | POA: Diagnosis not present

## 2023-11-29 DIAGNOSIS — Z95810 Presence of automatic (implantable) cardiac defibrillator: Secondary | ICD-10-CM | POA: Diagnosis not present

## 2023-11-29 DIAGNOSIS — C189 Malignant neoplasm of colon, unspecified: Secondary | ICD-10-CM | POA: Insufficient documentation

## 2023-11-29 LAB — BASIC METABOLIC PANEL
Anion gap: 8 (ref 5–15)
BUN: 17 mg/dL (ref 8–23)
CO2: 23 mmol/L (ref 22–32)
Calcium: 9.3 mg/dL (ref 8.9–10.3)
Chloride: 112 mmol/L — ABNORMAL HIGH (ref 98–111)
Creatinine, Ser: 1.19 mg/dL (ref 0.61–1.24)
GFR, Estimated: >60 mL/min (ref >60)
Glucose, Bld: 106 mg/dL — ABNORMAL HIGH (ref 70–99)
Potassium: 4.1 mmol/L (ref 3.5–5.1)
Sodium: 143 mmol/L (ref 135–145)

## 2023-11-29 NOTE — Addendum Note (Signed)
Encounter addended by: Noralee Space, RN on: 11/29/2023 9:50 AM  Actions taken: Order list changed, Diagnosis association updated, Clinical Note Signed, Charge Capture section accepted

## 2023-11-29 NOTE — Patient Instructions (Addendum)
Good to see you today!  Medication Changes:  No changes, continue current medications  Lab Work:  Labs done today, your results will be available in MyChart, we will contact you for abnormal readings.  Testing/Procedures:  Your physician has requested that you have an echocardiogram. Echocardiography is a painless test that uses sound waves to create images of your heart. It provides your doctor with information about the size and shape of your heart and how well your heart's chambers and valves are working. This procedure takes approximately one hour. There are no restrictions for this procedure. Please do NOT wear cologne, perfume, aftershave, or lotions (deodorant is allowed). Please arrive 15 minutes prior to your appointment time.  Please note: We ask at that you not bring children with you during ultrasound (echo/ vascular) testing. Due to room size and safety concerns, children are not allowed in the ultrasound rooms during exams. Our front office staff cannot provide observation of children in our lobby area while testing is being conducted. An adult accompanying a patient to their appointment will only be allowed in the ultrasound room at the discretion of the ultrasound technician under special circumstances. We apologize for any inconvenience.   Non-Cardiac CT Angiography (CTA), is a special type of CT scan that uses a computer to produce multi-dimensional views of major blood vessels throughout the body. In CT angiography, a contrast material is injected through an IV to help visualize the blood vessels. ONCE APPROVED BY INSURANCE    Special Instructions // Education:  Do the following things EVERYDAY: Weigh yourself in the morning before breakfast. Write it down and keep it in a log. Take your medicines as prescribed Eat low salt foods--Limit salt (sodium) to 2000 mg per day.  Stay as active as you can everyday Limit all fluids for the day to less than 2  liters   Follow-Up in: 1 year (Feb 2026), **PLEASE CALL OUR OFFICE IN DECEMBER 2025 TO SCHEDULE THIS APPOINTMENT   At the Advanced [facility] Failure Clinic, you and your health needs are our priority. We have a designated team specialized in the treatment of Heart Failure. This Care Team includes your primary Heart Failure Specialized Cardiologist (physician), Advanced Practice Providers (APPs- Physician Assistants and Nurse Practitioners), and Pharmacist who all work together to provide you with the care you need, when you need it.   You may see any of the following providers on your designated Care Team at your next follow up:  Dr. Arvilla Meres Dr. Marca Ancona Dr. Dorthula Nettles Dr. Theresia Bough Tonye Becket, NP Robbie Lis, [place] [facility] [place], [place] Brynda Peon, NP [place] Lee, NP Karle Plumber, PharmD   Please be sure to bring in all your medications bottles to every appointment.   Need to Contact us:  If you have any questions or concerns before your next appointment please send [place] a message through [place] or call our office at [phone].    TO LEAVE A MESSAGE FOR THE NURSE SELECT OPTION 2, PLEASE LEAVE A MESSAGE INCLUDING: YOUR NAME DATE OF BIRTH CALL BACK NUMBER REASON FOR CALL**this is important as we prioritize the call backs  YOU WILL RECEIVE A CALL BACK THE SAME DAY AS LONG AS YOU CALL BEFORE 4:00 PM    If you have any questions or concerns before your next appointment please send [place] a message through [place] or call our office at [phone].    TO LEAVE A MESSAGE FOR THE NURSE SELECT OPTION 2, PLEASE LEAVE A  MESSAGE INCLUDING: YOUR NAME DATE OF BIRTH CALL BACK NUMBER REASON FOR CALL**this is important as we prioritize the call backs  YOU WILL RECEIVE A CALL BACK THE SAME DAY AS LONG AS YOU CALL BEFORE 4:00 PM  At the [facility], you and your health needs are our priority. As part of our continuing mission to provide  you with exceptional heart care, we have created designated Provider Care Teams. These Care Teams include your primary Cardiologist (physician) and Advanced Practice Providers (APPs- Physician Assistants and Nurse Practitioners) who all work together to provide you with the care you need, when you need it.   You may see any of the following providers on your designated Care Team at your next follow up: Dr Arvilla Meres Dr Marca Ancona Dr. Dorthula Nettles Dr. Clearnce Hasten Amy Filbert Schilder, NP Robbie Lis, [place] [facility] [place], [place] Brynda Peon, NP [place] Lee, NP Karle Plumber, PharmD   Please be sure to bring in all your medications bottles to every appointment.    Thank you for choosing [facility]

## 2023-12-06 ENCOUNTER — Other Ambulatory Visit: Payer: Self-pay

## 2023-12-06 ENCOUNTER — Other Ambulatory Visit (HOSPITAL_COMMUNITY): Payer: Self-pay | Admitting: Internal Medicine

## 2023-12-06 ENCOUNTER — Other Ambulatory Visit (HOSPITAL_COMMUNITY): Payer: Self-pay

## 2023-12-08 ENCOUNTER — Other Ambulatory Visit: Payer: Self-pay

## 2023-12-08 ENCOUNTER — Other Ambulatory Visit (HOSPITAL_COMMUNITY): Payer: Self-pay

## 2023-12-08 MED ORDER — SYNTHROID 50 MCG PO TABS
50.0000 ug | ORAL_TABLET | Freq: Every day | ORAL | 3 refills | Status: AC
  Filled 2023-12-08: qty 90, 90d supply, fill #0
  Filled 2024-03-21 – 2024-10-17 (×2): qty 90, 90d supply, fill #1

## 2023-12-09 ENCOUNTER — Other Ambulatory Visit: Payer: Self-pay

## 2023-12-09 ENCOUNTER — Other Ambulatory Visit (HOSPITAL_COMMUNITY): Payer: Self-pay

## 2023-12-09 ENCOUNTER — Other Ambulatory Visit (HOSPITAL_COMMUNITY): Payer: Self-pay | Admitting: Internal Medicine

## 2023-12-09 MED ORDER — CARVEDILOL 12.5 MG PO TABS
12.5000 mg | ORAL_TABLET | Freq: Two times a day (BID) | ORAL | 6 refills | Status: DC
  Filled 2023-12-09: qty 90, 45d supply, fill #0
  Filled 2024-01-31: qty 90, 45d supply, fill #1
  Filled 2024-03-21: qty 90, 45d supply, fill #2
  Filled 2024-04-25: qty 90, 45d supply, fill #3
  Filled 2024-06-17: qty 90, 45d supply, fill #4
  Filled 2024-07-24: qty 90, 45d supply, fill #5
  Filled 2024-09-22: qty 90, 45d supply, fill #6

## 2023-12-24 ENCOUNTER — Other Ambulatory Visit: Payer: Self-pay

## 2023-12-24 ENCOUNTER — Other Ambulatory Visit (HOSPITAL_COMMUNITY): Payer: Self-pay | Admitting: Internal Medicine

## 2023-12-24 ENCOUNTER — Other Ambulatory Visit (HOSPITAL_COMMUNITY): Payer: Self-pay

## 2023-12-24 MED ORDER — SACUBITRIL-VALSARTAN 97-103 MG PO TABS
1.0000 | ORAL_TABLET | Freq: Two times a day (BID) | ORAL | 3 refills | Status: DC
  Filled 2023-12-24: qty 180, 90d supply, fill #0
  Filled 2024-03-21: qty 180, 90d supply, fill #1
  Filled 2024-06-17: qty 180, 90d supply, fill #2
  Filled 2024-09-22: qty 180, 90d supply, fill #3

## 2023-12-29 ENCOUNTER — Telehealth (HOSPITAL_COMMUNITY): Payer: Self-pay | Admitting: Vascular Surgery

## 2023-12-29 ENCOUNTER — Ambulatory Visit (HOSPITAL_COMMUNITY)
Admission: RE | Admit: 2023-12-29 | Discharge: 2023-12-29 | Disposition: A | Discharge status: Home / Self Care | Payer: PPO | Source: Ambulatory Visit | Attending: Internal Medicine | Admitting: Internal Medicine

## 2023-12-29 DIAGNOSIS — I340 Nonrheumatic mitral (valve) insufficiency: Secondary | ICD-10-CM | POA: Diagnosis not present

## 2023-12-29 DIAGNOSIS — I447 Left bundle-branch block, unspecified: Secondary | ICD-10-CM | POA: Insufficient documentation

## 2023-12-29 DIAGNOSIS — I428 Other cardiomyopathies: Secondary | ICD-10-CM | POA: Insufficient documentation

## 2023-12-29 DIAGNOSIS — Z950 Presence of cardiac pacemaker: Secondary | ICD-10-CM | POA: Diagnosis not present

## 2023-12-29 DIAGNOSIS — I2510 Atherosclerotic heart disease of native coronary artery without angina pectoris: Secondary | ICD-10-CM | POA: Insufficient documentation

## 2023-12-29 DIAGNOSIS — R9431 Abnormal electrocardiogram [ECG] [EKG]: Secondary | ICD-10-CM | POA: Diagnosis not present

## 2023-12-29 DIAGNOSIS — I5022 Chronic systolic (congestive) heart failure: Secondary | ICD-10-CM | POA: Diagnosis present

## 2023-12-29 LAB — ECHOCARDIOGRAM COMPLETE
AR max vel: 3.13 cm2
AV Area VTI: 2.74 cm2
AV Area mean vel: 2.61 cm2
AV Mean grad: 2 mmHg
AV Peak grad: 3.1 mmHg
Ao pk vel: 0.87 m/s
Area-P 1/2: 1.93 cm2
Calc EF: 51.1 %
MV VTI: 2.22 cm2
P 1/2 time: 561 ms
S' Lateral: 3.35 cm
Single Plane A2C EF: 52.7 %
Single Plane A4C EF: 51.5 %

## 2023-12-29 NOTE — Telephone Encounter (Signed)
 Lvm to giving CTA 3/11 @ 330

## 2023-12-29 NOTE — Progress Notes (Signed)
  Echocardiogram 2D Echocardiogram has been performed.  Ocie Doyne RDCS 12/29/2023, 9:32 AM

## 2023-12-31 NOTE — Progress Notes (Signed)
 Remote ICD transmission.

## 2024-01-04 ENCOUNTER — Ambulatory Visit (HOSPITAL_COMMUNITY)

## 2024-01-10 ENCOUNTER — Ambulatory Visit (HOSPITAL_COMMUNITY)
Admission: RE | Admit: 2024-01-10 | Discharge: 2024-01-10 | Disposition: A | Discharge status: Home / Self Care | Source: Ambulatory Visit | Attending: Internal Medicine | Admitting: Internal Medicine

## 2024-01-10 DIAGNOSIS — I77810 Thoracic aortic ectasia: Secondary | ICD-10-CM | POA: Insufficient documentation

## 2024-01-10 MED ORDER — IOHEXOL 350 MG/ML SOLN
75.0000 mL | Freq: Once | INTRAVENOUS | Status: AC | PRN
  Administered 2024-01-10: 75 mL via INTRAVENOUS

## 2024-01-18 ENCOUNTER — Other Ambulatory Visit (HOSPITAL_COMMUNITY): Payer: Self-pay

## 2024-01-18 ENCOUNTER — Other Ambulatory Visit: Payer: Self-pay

## 2024-01-31 ENCOUNTER — Other Ambulatory Visit (HOSPITAL_COMMUNITY): Payer: Self-pay

## 2024-01-31 MED ORDER — ZOLPIDEM TARTRATE 5 MG PO TABS
5.0000 mg | ORAL_TABLET | Freq: Every evening | ORAL | 0 refills | Status: DC | PRN
  Filled 2024-01-31: qty 30, 30d supply, fill #0

## 2024-02-01 ENCOUNTER — Other Ambulatory Visit (HOSPITAL_COMMUNITY): Payer: Self-pay

## 2024-02-15 ENCOUNTER — Encounter (HOSPITAL_COMMUNITY): Payer: Self-pay | Admitting: *Deleted

## 2024-02-15 ENCOUNTER — Other Ambulatory Visit (HOSPITAL_COMMUNITY): Payer: Self-pay

## 2024-02-15 ENCOUNTER — Other Ambulatory Visit: Payer: Self-pay

## 2024-02-21 ENCOUNTER — Ambulatory Visit (INDEPENDENT_AMBULATORY_CARE_PROVIDER_SITE_OTHER): Payer: PPO

## 2024-02-21 DIAGNOSIS — I428 Other cardiomyopathies: Secondary | ICD-10-CM

## 2024-02-21 DIAGNOSIS — I5022 Chronic systolic (congestive) heart failure: Secondary | ICD-10-CM | POA: Diagnosis not present

## 2024-02-21 LAB — CUP PACEART REMOTE DEVICE CHECK
Battery Remaining Longevity: 66 mo
Battery Remaining Percentage: 74 %
Brady Statistic RA Percent Paced: 41 %
Brady Statistic RV Percent Paced: 93 %
Date Time Interrogation Session: 20250428042300
HighPow Impedance: 61 Ohm
Implantable Lead Connection Status: 753985
Implantable Lead Connection Status: 753985
Implantable Lead Connection Status: 753985
Implantable Lead Implant Date: 20211102
Implantable Lead Implant Date: 20211102
Implantable Lead Implant Date: 20211102
Implantable Lead Location: 753858
Implantable Lead Location: 753859
Implantable Lead Location: 753860
Implantable Lead Model: 137
Implantable Lead Model: 4671
Implantable Lead Model: 7840
Implantable Pulse Generator Implant Date: 20211102
Lead Channel Impedance Value: 374 Ohm
Lead Channel Impedance Value: 448 Ohm
Lead Channel Impedance Value: 474 Ohm
Lead Channel Pacing Threshold Amplitude: 0.6 V
Lead Channel Pacing Threshold Amplitude: 0.8 V
Lead Channel Pacing Threshold Pulse Width: 0.4 ms
Lead Channel Pacing Threshold Pulse Width: 0.4 ms
Lead Channel Setting Pacing Amplitude: 2 V
Lead Channel Setting Pacing Amplitude: 2.2 V
Lead Channel Setting Pacing Amplitude: 3 V
Lead Channel Setting Pacing Pulse Width: 0.4 ms
Lead Channel Setting Pacing Pulse Width: 1 ms
Lead Channel Setting Sensing Sensitivity: 0.5 mV
Lead Channel Setting Sensing Sensitivity: 1 mV
Pulse Gen Serial Number: 387447

## 2024-02-22 ENCOUNTER — Encounter: Payer: Self-pay | Admitting: Internal Medicine

## 2024-02-22 ENCOUNTER — Telehealth: Payer: Self-pay | Admitting: *Deleted

## 2024-02-22 NOTE — Telephone Encounter (Signed)
 Alert received from [employer]:   ICD Scheduled remote reviewed. Normal device function.  Presenting rhythm: AP-BIV paced, occasional PVC.  HF diagnostics have been normal in this monitoring period.  1 VT episode on 02/05/24 lasting 63 beats (reported 32 seconds), however, 21 of these beats are below detection rate.  Called and spoke with patient Patient denies experiencing any symptoms such as lightheadedness, chest pain, dizziness, or SOB Patient confirms that he is current and compliant with all his medications including Mexitil  and Coreg  Patient instructed to call clinic if he ever experiences the aforementioned symptoms Patient acknowledged understanding of instructions provided by this RN  Will route to MD for review and advisement

## 2024-02-22 NOTE — Telephone Encounter (Signed)
 No change

## 2024-02-24 ENCOUNTER — Other Ambulatory Visit (HOSPITAL_COMMUNITY): Payer: Self-pay

## 2024-03-03 ENCOUNTER — Ambulatory Visit: Admitting: Student

## 2024-03-21 ENCOUNTER — Other Ambulatory Visit: Payer: Self-pay

## 2024-03-21 ENCOUNTER — Other Ambulatory Visit (HOSPITAL_COMMUNITY): Payer: Self-pay

## 2024-03-22 ENCOUNTER — Other Ambulatory Visit (HOSPITAL_COMMUNITY): Payer: Self-pay

## 2024-03-22 MED ORDER — ZOLPIDEM TARTRATE 5 MG PO TABS
5.0000 mg | ORAL_TABLET | Freq: Every evening | ORAL | 0 refills | Status: DC | PRN
  Filled 2024-03-22: qty 30, 30d supply, fill #0

## 2024-04-11 NOTE — Progress Notes (Signed)
 Remote ICD transmission.

## 2024-04-20 ENCOUNTER — Other Ambulatory Visit (HOSPITAL_COMMUNITY): Payer: Self-pay | Admitting: Internal Medicine

## 2024-04-20 ENCOUNTER — Other Ambulatory Visit (HOSPITAL_COMMUNITY): Payer: Self-pay

## 2024-04-24 ENCOUNTER — Other Ambulatory Visit: Payer: Self-pay

## 2024-04-24 MED ORDER — MEXILETINE HCL 200 MG PO CAPS
200.0000 mg | ORAL_CAPSULE | Freq: Two times a day (BID) | ORAL | 3 refills | Status: AC
  Filled 2024-04-24: qty 60, 30d supply, fill #0
  Filled 2024-05-24: qty 60, 30d supply, fill #1
  Filled 2024-06-17: qty 60, 30d supply, fill #2
  Filled 2024-07-24: qty 60, 30d supply, fill #3
  Filled 2024-08-21: qty 60, 30d supply, fill #4
  Filled 2024-09-22: qty 60, 30d supply, fill #5
  Filled 2024-10-17: qty 60, 30d supply, fill #6
  Filled 2024-11-13: qty 60, 30d supply, fill #7

## 2024-04-25 ENCOUNTER — Other Ambulatory Visit (HOSPITAL_COMMUNITY): Payer: Self-pay

## 2024-05-22 ENCOUNTER — Ambulatory Visit (INDEPENDENT_AMBULATORY_CARE_PROVIDER_SITE_OTHER): Payer: PPO

## 2024-05-22 DIAGNOSIS — I428 Other cardiomyopathies: Secondary | ICD-10-CM

## 2024-05-23 LAB — CUP PACEART REMOTE DEVICE CHECK
Battery Remaining Longevity: 60 mo
Battery Remaining Percentage: 70 %
Brady Statistic RA Percent Paced: 38 %
Brady Statistic RV Percent Paced: 94 %
Date Time Interrogation Session: 20250728044800
HighPow Impedance: 61 Ohm
Implantable Lead Connection Status: 753985
Implantable Lead Connection Status: 753985
Implantable Lead Connection Status: 753985
Implantable Lead Implant Date: 20211102
Implantable Lead Implant Date: 20211102
Implantable Lead Implant Date: 20211102
Implantable Lead Location: 753858
Implantable Lead Location: 753859
Implantable Lead Location: 753860
Implantable Lead Model: 137
Implantable Lead Model: 4671
Implantable Lead Model: 7840
Implantable Pulse Generator Implant Date: 20211102
Lead Channel Impedance Value: 375 Ohm
Lead Channel Impedance Value: 424 Ohm
Lead Channel Impedance Value: 471 Ohm
Lead Channel Pacing Threshold Amplitude: 0.6 V
Lead Channel Pacing Threshold Amplitude: 0.7 V
Lead Channel Pacing Threshold Pulse Width: 0.4 ms
Lead Channel Pacing Threshold Pulse Width: 0.4 ms
Lead Channel Setting Pacing Amplitude: 2 V
Lead Channel Setting Pacing Amplitude: 2.2 V
Lead Channel Setting Pacing Amplitude: 3 V
Lead Channel Setting Pacing Pulse Width: 0.4 ms
Lead Channel Setting Pacing Pulse Width: 1 ms
Lead Channel Setting Sensing Sensitivity: 0.5 mV
Lead Channel Setting Sensing Sensitivity: 1 mV
Pulse Gen Serial Number: 387447

## 2024-05-24 ENCOUNTER — Other Ambulatory Visit (HOSPITAL_COMMUNITY): Payer: Self-pay

## 2024-05-26 ENCOUNTER — Ambulatory Visit: Payer: Self-pay | Admitting: Internal Medicine

## 2024-06-17 ENCOUNTER — Other Ambulatory Visit (HOSPITAL_COMMUNITY): Payer: Self-pay

## 2024-06-19 ENCOUNTER — Other Ambulatory Visit: Payer: Self-pay

## 2024-06-19 ENCOUNTER — Other Ambulatory Visit (HOSPITAL_COMMUNITY): Payer: Self-pay

## 2024-06-19 ENCOUNTER — Other Ambulatory Visit (HOSPITAL_BASED_OUTPATIENT_CLINIC_OR_DEPARTMENT_OTHER): Payer: Self-pay

## 2024-06-19 ENCOUNTER — Telehealth (HOSPITAL_COMMUNITY): Payer: Self-pay

## 2024-06-19 MED ORDER — ZOLPIDEM TARTRATE 5 MG PO TABS
5.0000 mg | ORAL_TABLET | Freq: Every evening | ORAL | 0 refills | Status: DC | PRN
  Filled 2024-06-19: qty 30, 30d supply, fill #0

## 2024-06-19 MED ORDER — BETHANECHOL CHLORIDE 50 MG PO TABS
50.0000 mg | ORAL_TABLET | Freq: Three times a day (TID) | ORAL | 6 refills | Status: AC
  Filled 2024-06-19: qty 90, 30d supply, fill #0

## 2024-06-19 NOTE — Telephone Encounter (Signed)
 Advanced Heart Failure Patient Advocate Encounter  The patient was approved for a [employer] grant that will help cover the cost of Carvedilol , Entresto , Spironolactone .  Total amount awarded, $7,500.  Effective: 05/20/2024 - 05/19/2025.  BIN [id number] PCN [id number] [id number] ID [id number]  Approval and processing information added to [facility], contacted pharmacy to process rx, confirmed $0 copay. Patient informed via phone.  Rachel DEL, CPhT Rx Patient Advocate Phone: [phone]

## 2024-07-24 ENCOUNTER — Other Ambulatory Visit (HOSPITAL_COMMUNITY): Payer: Self-pay

## 2024-07-27 NOTE — Progress Notes (Signed)
 Remote ICD Transmission

## 2024-08-21 ENCOUNTER — Other Ambulatory Visit: Payer: Self-pay

## 2024-08-21 ENCOUNTER — Other Ambulatory Visit (HOSPITAL_COMMUNITY): Payer: Self-pay

## 2024-08-21 ENCOUNTER — Ambulatory Visit (INDEPENDENT_AMBULATORY_CARE_PROVIDER_SITE_OTHER): Payer: PPO

## 2024-08-21 DIAGNOSIS — I493 Ventricular premature depolarization: Secondary | ICD-10-CM | POA: Diagnosis not present

## 2024-08-21 LAB — CUP PACEART REMOTE DEVICE CHECK
Battery Remaining Longevity: 54 mo
Battery Remaining Percentage: 65 %
Brady Statistic RA Percent Paced: 36 %
Brady Statistic RV Percent Paced: 94 %
Date Time Interrogation Session: 20251027041100
HighPow Impedance: 59 Ohm
Implantable Lead Connection Status: 753985
Implantable Lead Connection Status: 753985
Implantable Lead Connection Status: 753985
Implantable Lead Implant Date: 20211102
Implantable Lead Implant Date: 20211102
Implantable Lead Implant Date: 20211102
Implantable Lead Location: 753858
Implantable Lead Location: 753859
Implantable Lead Location: 753860
Implantable Lead Model: 137
Implantable Lead Model: 4671
Implantable Lead Model: 7840
Implantable Pulse Generator Implant Date: 20211102
Lead Channel Impedance Value: 386 Ohm
Lead Channel Impedance Value: 387 Ohm
Lead Channel Impedance Value: 457 Ohm
Lead Channel Pacing Threshold Amplitude: 0.5 V
Lead Channel Pacing Threshold Amplitude: 0.8 V
Lead Channel Pacing Threshold Pulse Width: 0.4 ms
Lead Channel Pacing Threshold Pulse Width: 0.4 ms
Lead Channel Setting Pacing Amplitude: 2 V
Lead Channel Setting Pacing Amplitude: 2.2 V
Lead Channel Setting Pacing Amplitude: 3 V
Lead Channel Setting Pacing Pulse Width: 0.4 ms
Lead Channel Setting Pacing Pulse Width: 1 ms
Lead Channel Setting Sensing Sensitivity: 0.5 mV
Lead Channel Setting Sensing Sensitivity: 1 mV
Pulse Gen Serial Number: 387447

## 2024-08-21 MED ORDER — ZOLPIDEM TARTRATE 5 MG PO TABS
5.0000 mg | ORAL_TABLET | Freq: Every evening | ORAL | 0 refills | Status: AC | PRN
  Filled 2024-08-21: qty 30, 30d supply, fill #0

## 2024-08-24 ENCOUNTER — Other Ambulatory Visit: Payer: Self-pay

## 2024-08-24 ENCOUNTER — Other Ambulatory Visit (HOSPITAL_COMMUNITY): Payer: Self-pay

## 2024-08-24 ENCOUNTER — Other Ambulatory Visit (HOSPITAL_BASED_OUTPATIENT_CLINIC_OR_DEPARTMENT_OTHER): Payer: Self-pay

## 2024-08-24 ENCOUNTER — Ambulatory Visit: Payer: Self-pay | Admitting: Internal Medicine

## 2024-08-24 MED ORDER — CEPHALEXIN 500 MG PO CAPS
500.0000 mg | ORAL_CAPSULE | Freq: Three times a day (TID) | ORAL | 0 refills | Status: AC
  Filled 2024-08-24 (×2): qty 30, 10d supply, fill #0

## 2024-08-29 NOTE — Progress Notes (Signed)
 Remote ICD Transmission

## 2024-09-22 ENCOUNTER — Other Ambulatory Visit (HOSPITAL_COMMUNITY): Payer: Self-pay

## 2024-09-29 ENCOUNTER — Other Ambulatory Visit (HOSPITAL_COMMUNITY): Payer: Self-pay

## 2024-09-29 MED ORDER — ALFUZOSIN HCL ER 10 MG PO TB24
10.0000 mg | ORAL_TABLET | Freq: Every day | ORAL | 1 refills | Status: AC
  Filled 2024-09-29: qty 90, 90d supply, fill #0

## 2024-09-29 MED ORDER — BETHANECHOL CHLORIDE 50 MG PO TABS
50.0000 mg | ORAL_TABLET | Freq: Three times a day (TID) | ORAL | 6 refills | Status: AC
  Filled 2024-09-29: qty 90, 30d supply, fill #0

## 2024-09-30 ENCOUNTER — Other Ambulatory Visit (HOSPITAL_COMMUNITY): Payer: Self-pay

## 2024-10-17 ENCOUNTER — Other Ambulatory Visit (HOSPITAL_COMMUNITY): Payer: Self-pay

## 2024-11-13 ENCOUNTER — Other Ambulatory Visit: Payer: Self-pay

## 2024-11-13 ENCOUNTER — Other Ambulatory Visit (HOSPITAL_COMMUNITY): Payer: Self-pay

## 2024-11-13 ENCOUNTER — Other Ambulatory Visit (HOSPITAL_COMMUNITY): Payer: Self-pay | Admitting: Internal Medicine

## 2024-11-13 MED ORDER — CARVEDILOL 12.5 MG PO TABS
12.5000 mg | ORAL_TABLET | Freq: Two times a day (BID) | ORAL | 3 refills | Status: AC
  Filled 2024-11-13: qty 180, 90d supply, fill #0

## 2024-11-13 MED ORDER — SACUBITRIL-VALSARTAN 97-103 MG PO TABS
1.0000 | ORAL_TABLET | Freq: Two times a day (BID) | ORAL | 3 refills | Status: AC
  Filled 2024-11-13: qty 180, 90d supply, fill #0

## 2024-11-14 ENCOUNTER — Other Ambulatory Visit: Payer: Self-pay

## 2024-11-20 ENCOUNTER — Ambulatory Visit: Payer: PPO

## 2024-11-20 DIAGNOSIS — I255 Ischemic cardiomyopathy: Secondary | ICD-10-CM | POA: Diagnosis not present

## 2024-11-21 ENCOUNTER — Ambulatory Visit: Payer: Self-pay | Admitting: Cardiology

## 2024-11-21 LAB — CUP PACEART REMOTE DEVICE CHECK
Battery Remaining Longevity: 54 mo
Battery Remaining Percentage: 63 %
Brady Statistic RA Percent Paced: 34 %
Brady Statistic RV Percent Paced: 94 %
Date Time Interrogation Session: 20260126050100
HighPow Impedance: 68 Ohm
Implantable Lead Connection Status: 753985
Implantable Lead Connection Status: 753985
Implantable Lead Connection Status: 753985
Implantable Lead Implant Date: 20211102
Implantable Lead Implant Date: 20211102
Implantable Lead Implant Date: 20211102
Implantable Lead Location: 753858
Implantable Lead Location: 753859
Implantable Lead Location: 753860
Implantable Lead Model: 137
Implantable Lead Model: 4671
Implantable Lead Model: 7840
Implantable Pulse Generator Implant Date: 20211102
Lead Channel Impedance Value: 385 Ohm
Lead Channel Impedance Value: 447 Ohm
Lead Channel Impedance Value: 458 Ohm
Lead Channel Pacing Threshold Amplitude: 0.7 V
Lead Channel Pacing Threshold Amplitude: 0.8 V
Lead Channel Pacing Threshold Pulse Width: 0.4 ms
Lead Channel Pacing Threshold Pulse Width: 0.4 ms
Lead Channel Setting Pacing Amplitude: 2 V
Lead Channel Setting Pacing Amplitude: 2.2 V
Lead Channel Setting Pacing Amplitude: 3 V
Lead Channel Setting Pacing Pulse Width: 0.4 ms
Lead Channel Setting Pacing Pulse Width: 1 ms
Lead Channel Setting Sensing Sensitivity: 0.5 mV
Lead Channel Setting Sensing Sensitivity: 1 mV
Pulse Gen Serial Number: 387447

## 2024-11-27 NOTE — Progress Notes (Signed)
 Remote ICD Transmission

## 2025-02-19 ENCOUNTER — Ambulatory Visit: Payer: PPO

## 2025-05-21 ENCOUNTER — Ambulatory Visit: Payer: PPO

## 2025-08-20 ENCOUNTER — Ambulatory Visit: Payer: PPO

## 2025-11-19 ENCOUNTER — Ambulatory Visit: Payer: PPO
